# Patient Record
Sex: Female | Born: 1965 | Race: Black or African American | Hispanic: No | Marital: Single | Smoking: Current every day smoker
Health system: Southern US, Community
[De-identification: ages and names within clinical notes are randomized; demographics above are authoritative.]

## PROBLEM LIST (undated history)

## (undated) DIAGNOSIS — K219 Gastro-esophageal reflux disease without esophagitis: Secondary | ICD-10-CM

## (undated) DIAGNOSIS — J189 Pneumonia, unspecified organism: Secondary | ICD-10-CM

## (undated) HISTORY — PX: WISDOM TOOTH EXTRACTION: SHX21

---

## 2019-03-30 ENCOUNTER — Emergency Department (HOSPITAL_COMMUNITY): Payer: Self-pay

## 2019-03-30 ENCOUNTER — Ambulatory Visit
Admission: EM | Admit: 2019-03-30 | Discharge: 2019-03-30 | Disposition: A | Discharge status: Home / Self Care | Payer: Self-pay | Attending: Emergency Medicine | Admitting: Emergency Medicine

## 2019-03-30 ENCOUNTER — Ambulatory Visit (INDEPENDENT_AMBULATORY_CARE_PROVIDER_SITE_OTHER): Payer: Self-pay

## 2019-03-30 ENCOUNTER — Other Ambulatory Visit: Payer: Self-pay

## 2019-03-30 ENCOUNTER — Emergency Department (HOSPITAL_COMMUNITY)
Admission: EM | Admit: 2019-03-30 | Discharge: 2019-03-30 | Disposition: A | Discharge status: Home / Self Care | Payer: Self-pay | Attending: Emergency Medicine | Admitting: Emergency Medicine

## 2019-03-30 ENCOUNTER — Encounter (HOSPITAL_COMMUNITY): Payer: Self-pay | Admitting: Emergency Medicine

## 2019-03-30 DIAGNOSIS — S42201A Unspecified fracture of upper end of right humerus, initial encounter for closed fracture: Secondary | ICD-10-CM | POA: Insufficient documentation

## 2019-03-30 DIAGNOSIS — Y999 Unspecified external cause status: Secondary | ICD-10-CM | POA: Insufficient documentation

## 2019-03-30 DIAGNOSIS — Z87891 Personal history of nicotine dependence: Secondary | ICD-10-CM | POA: Insufficient documentation

## 2019-03-30 DIAGNOSIS — Y929 Unspecified place or not applicable: Secondary | ICD-10-CM | POA: Insufficient documentation

## 2019-03-30 DIAGNOSIS — Y939 Activity, unspecified: Secondary | ICD-10-CM | POA: Insufficient documentation

## 2019-03-30 DIAGNOSIS — W1839XA Other fall on same level, initial encounter: Secondary | ICD-10-CM | POA: Insufficient documentation

## 2019-03-30 LAB — CBC WITH DIFFERENTIAL/PLATELET
Abs Immature Granulocytes: 0.03 10*3/uL (ref 0.00–0.07)
Basophils Absolute: 0 10*3/uL (ref 0.0–0.1)
Basophils Relative: 0 %
Eosinophils Absolute: 0.1 10*3/uL (ref 0.0–0.5)
Eosinophils Relative: 1 %
HCT: 37.3 % (ref 36.0–46.0)
Hemoglobin: 12.1 g/dL (ref 12.0–15.0)
Immature Granulocytes: 0 %
Lymphocytes Relative: 27 %
Lymphs Abs: 1.9 10*3/uL (ref 0.7–4.0)
MCH: 29.5 pg (ref 26.0–34.0)
MCHC: 32.4 g/dL (ref 30.0–36.0)
MCV: 91 fL (ref 80.0–100.0)
Monocytes Absolute: 0.6 10*3/uL (ref 0.1–1.0)
Monocytes Relative: 9 %
Neutro Abs: 4.4 10*3/uL (ref 1.7–7.7)
Neutrophils Relative %: 63 %
Platelets: 389 10*3/uL (ref 150–400)
RBC: 4.1 MIL/uL (ref 3.87–5.11)
RDW: 13.3 % (ref 11.5–15.5)
WBC: 7.1 10*3/uL (ref 4.0–10.5)
nRBC: 0 % (ref 0.0–0.2)

## 2019-03-30 LAB — BASIC METABOLIC PANEL
Anion gap: 11 (ref 5–15)
BUN: 11 mg/dL (ref 6–20)
CO2: 23 mmol/L (ref 22–32)
Calcium: 9.8 mg/dL (ref 8.9–10.3)
Chloride: 107 mmol/L (ref 98–111)
Creatinine, Ser: 0.59 mg/dL (ref 0.44–1.00)
GFR calc Af Amer: >60 mL/min (ref >60)
GFR calc non Af Amer: >60 mL/min (ref >60)
Glucose, Bld: 118 mg/dL — ABNORMAL HIGH (ref 70–99)
Potassium: 3.6 mmol/L (ref 3.5–5.1)
Sodium: 141 mmol/L (ref 135–145)

## 2019-03-30 MED ORDER — OXYCODONE-ACETAMINOPHEN 5-325 MG PO TABS: 1.0000 | ORAL_TABLET | Freq: Four times a day (QID) | ORAL | 0 refills | Status: DC | PRN

## 2019-03-30 MED ORDER — IOHEXOL 350 MG/ML SOLN
100.0000 mL | Freq: Once | INTRAVENOUS | Status: AC | PRN
  Administered 2019-03-30: 100 mL via INTRAVENOUS

## 2019-03-30 MED ORDER — OXYCODONE-ACETAMINOPHEN 5-325 MG PO TABS
1.0000 | ORAL_TABLET | Freq: Once | ORAL | Status: AC
  Administered 2019-03-30: 1 via ORAL
  Filled 2019-03-30: qty 1

## 2019-03-30 NOTE — ED Provider Notes (Addendum)
[facility] EMERGENCY DEPARTMENT Provider Note   CSN: [id number] Arrival date & time: 03/30/19  1244    History   Chief Complaint Chief Complaint  Patient presents with  . Shoulder Injury    HPI Natalie Washington is a 53 y.o. female.     HPI   53 year old female presents today with complaints of humerus fracture.  Patient notes that 25 days ago she stood up got dizzy fell and struck her right shoulder.  She notes immediate pain at that time.  She did not believe it was a significant injury and felt that if she gave a time it would go away.  She notes she started develop bruising around the right upper arm, decreased range of motion and pain.  Patient denies any loss of sensation to the extremity, denies any loss of grip strength to the hand or range of motion at the elbow.  She notes she went to the health apartment today for evaluation of her dizziness where she had an EKG labs.  At that time she was diagnosed with yeast vaginitis.  They also referred her to urgent care for imaging of her right shoulder.  At urgent care she was noted to have decreased pulse in her right radius with the additional humerus fracture, she was referred to the emergency room at that time.  Patient recalls being told at 1 point in time that she did have weak pulses in the right arm but is very vague on this.  She denies any chest pain or shortness of breath.  She notes the dizziness is only present when she stands and is not consistent.  She denies any associated neurological deficits with this.   History reviewed. No pertinent past medical history.  There are no active problems to display for this patient.   History reviewed. No pertinent surgical history.   OB History   No obstetric history on file.      Home Medications    Prior to Admission medications   Medication Sig Start Date End Date Taking? Authorizing Provider  oxyCODONE-acetaminophen (PERCOCET/ROXICET) 5-325 MG tablet  Take 1 tablet by mouth every 6 (six) hours as needed for severe pain. 03/30/19   Okey Regal, PA-C    Family History Family History  Family history unknown: Yes    Social History Social History   Tobacco Use  . Smoking status: Former Research scientist (life sciences)  . Smokeless tobacco: Never Used  Substance Use Topics  . Alcohol use: Yes  . Drug use: Never     Allergies   Patient has no known allergies.   Review of Systems Review of Systems  All other systems reviewed and are negative.    Physical Exam Updated Vital Signs BP 109/81 (BP Location: Left Arm)   Pulse 88   Temp 98.7 F (37.1 C) (Oral)   Resp 18   Wt 64.4 kg   LMP  (LMP Unknown)   SpO2 100%   Physical Exam Vitals signs and nursing note reviewed.  Constitutional:      Appearance: She is well-developed.  HENT:     Head: Normocephalic and atraumatic.  Eyes:     General: No scleral icterus.       Right eye: No discharge.        Left eye: No discharge.     Conjunctiva/sclera: Conjunctivae normal.     Pupils: Pupils are equal, round, and reactive to light.  Neck:     Musculoskeletal: Normal range of motion.  Vascular: No JVD.     Trachea: No tracheal deviation.  Cardiovascular:     Rate and Rhythm: Normal rate and regular rhythm.  Pulmonary:     Effort: Pulmonary effort is normal.     Breath sounds: No stridor.  Musculoskeletal:     Comments: Minor swelling at the right proximal humerus with minimal tenderness, no overlying soft to injury-bruising noted to the medial mid humerus, joint compartments are soft, grip strength 5 out of 5 sensation and range of motion to the elbow wrist and hand intact-radial pulse 1+ right, radial pulse 2+ left- cap refill less than 3 seconds  Neurological:     Mental Status: She is alert and oriented to person, place, and time.     Coordination: Coordination normal.  Psychiatric:        Behavior: Behavior normal.        Thought Content: Thought content normal.        Judgment:  Judgment normal.     ED Treatments / Results  Labs (all labs ordered are listed, but only abnormal results are displayed) Labs Reviewed  BASIC METABOLIC PANEL - Abnormal; Notable for the following components:      Result Value   Glucose, Bld 118 (*)    All other components within normal limits  CBC WITH DIFFERENTIAL/PLATELET    EKG EKG Interpretation  Date/Time:  Tuesday March 30 2019 16:28:27 EDT Ventricular Rate:  91 PR Interval:    QRS Duration: 73 QT Interval:  347 QTC Calculation: 427 R Axis:   74 Text Interpretation:  Sinus rhythm Low voltage, precordial leads Baseline wander in lead(s) II III aVF V4 V5 Confirmed by Dene Gentry [phone]) on 03/30/2019 4:41:20 PM   Radiology Ct Angio Up Extrem Right W &/or Wo Contrast  Result Date: 03/30/2019 CLINICAL DATA:  Right arm pain for the past 5 days after falling on her right shoulder. Right arm claudication and diminished pulses. EXAM: CT ANGIOGRAPHY UPPER RIGHT EXTREMITY TECHNIQUE: Axial images of the right upper extremity were obtained during the intravenous administration of contrast with sagittal and coronal reconstruction images. CONTRAST:  165mL OMNIPAQUE IOHEXOL 350 MG/ML SOLN COMPARISON:  Right humerus radiographs dated 03/30/2019. FINDINGS: Previously described comminuted right humeral neck fracture with impaction and angulation. Edema in the subcutaneous fat dorsally at the level of the elbow and forearm. No forearm fracture or dislocation is seen. The right upper extremity arteries are normally opacified occlusions, stenosis or contrast extravasation. Review of the MIP images confirms the above findings. IMPRESSION: 1. Comminuted right humeral neck fracture with impaction and angulation. 2. No evidence of arterial injury, occlusion or stenosis. Electronically Signed   By: Claudie Revering M.D.   On: 03/30/2019 18:32   Dg Humerus Right  Result Date: 03/30/2019 CLINICAL DATA:  Fall several days ago with right arm pain, initial  encounter EXAM: RIGHT HUMERUS - 2+ VIEW COMPARISON:  None. FINDINGS: There is a fracture through the surgical neck of right humerus with impaction and angulation at the fracture site. Humeral head remains well seated. No other focal abnormality is noted. IMPRESSION: Fracture of the proximal right humerus as described. Electronically Signed   By: Inez Catalina M.D.   On: 03/30/2019 11:44    Procedures Procedures (including critical care time)  Medications Ordered in ED Medications  oxyCODONE-acetaminophen (PERCOCET/ROXICET) 5-325 MG per tablet 1 tablet (1 tablet Oral Given 03/30/19 1728)  iohexol (OMNIPAQUE) 350 MG/ML injection 100 mL (100 mLs Intravenous Contrast Given 03/30/19 1813)     Initial  Impression / Assessment and Plan / ED Course  I have reviewed the triage vital signs and the nursing notes.  Pertinent labs & imaging results that were available during my care of the patient were reviewed by me and considered in my medical decision making (see chart for details).        Labs: CBC, BMP  Imaging: CT angio right upper extremity, ED EKG  Consults:  Therapeutics:  Discharge Meds:   Assessment/Plan: 53 year old female presents today with proximal humerus fracture.  Patient does have unequal radial pulses, CT Angio of the arm shows no significant abnormality.  Given the fracture pattern this is very unlikely to be related to the fracture.  Patient does report some dizziness upon standing, none presently, she was worked up as an outpatient, no significant abnormalities noted on EKG or labs here.  No acute cardiac abnormality suspected at this time.  She will continue outpatient follow-up for this.  She will follow-up as an outpatient with orthopedic surgery for further evaluation management.  She is given strict return precautions.  She verbalized understanding and agreement to today's plan.   Final Clinical Impressions(s) / ED Diagnoses   Final diagnoses:  Closed fracture of  proximal end of right humerus, unspecified fracture morphology, initial encounter    ED Discharge Orders         Ordered    oxyCODONE-acetaminophen (PERCOCET/ROXICET) 5-325 MG tablet  Every 6 hours PRN     03/30/19 1841           Okey Regal, PA-C 03/30/19 2052    Reilyn Nelson, Dellis Filbert, PA-C 03/30/19 2052    Valarie Merino, MD 03/31/19 [phone]

## 2019-03-30 NOTE — ED Triage Notes (Signed)
Pt sent by [facility] with c/o R upper arm pain. Pt fell on Thursday, had an xray done today and it confirmed a R upper arm fx.

## 2019-03-30 NOTE — Consult Note (Signed)
Reason for Consult:Right humerus fx Referring Physician: Dot Lanes Richoux is an 53 y.o. female.  HPI: Natalie Washington was at home and got dizzy and fell. She has been getting dizzy for a while now. She hurt her right shoulder but it wasn't excruciating so she didn't come in for evaluation until today. This happened on Thursday. She is RHD.  History reviewed. No pertinent past medical history.  History reviewed. No pertinent surgical history.  Family History  Family history unknown: Yes    Social History:  reports that she has quit smoking. She has never used smokeless tobacco. She reports current alcohol use. She reports that she does not use drugs.  Allergies: No Known Allergies  Medications: I have reviewed the patient's current medications.  No results found for this or any previous visit (from the past 48 hour(s)).  Dg Humerus Right  Result Date: 03/30/2019 CLINICAL DATA:  Fall several days ago with right arm pain, initial encounter EXAM: RIGHT HUMERUS - 2+ VIEW COMPARISON:  None. FINDINGS: There is a fracture through the surgical neck of right humerus with impaction and angulation at the fracture site. Humeral head remains well seated. No other focal abnormality is noted. IMPRESSION: Fracture of the proximal right humerus as described. Electronically Signed   By: Inez Catalina M.D.   On: 03/30/2019 11:44    Review of Systems  Constitutional: Negative for weight loss.  HENT: Negative for ear discharge, ear pain, hearing loss and tinnitus.   Eyes: Negative for blurred vision, double vision, photophobia and pain.  Respiratory: Negative for cough, sputum production and shortness of breath.   Cardiovascular: Negative for chest pain.  Gastrointestinal: Negative for abdominal pain, nausea and vomiting.  Genitourinary: Negative for dysuria, flank pain, frequency and urgency.  Musculoskeletal: Positive for joint pain (Right shoulder/upper arm). Negative for back pain, falls,  myalgias and neck pain.  Neurological: Negative for dizziness, tingling, sensory change, focal weakness, loss of consciousness and headaches.  Endo/Heme/Allergies: Does not bruise/bleed easily.  Psychiatric/Behavioral: Negative for depression, memory loss and substance abuse. The patient is not nervous/anxious.    Blood pressure 109/81, pulse 88, temperature 98.7 F (37.1 C), temperature source Oral, resp. rate 18, weight 64.4 kg, SpO2 100 %. Physical Exam  Constitutional: She appears well-developed and well-nourished. No distress.  HENT:  Head: Normocephalic and atraumatic.  Eyes: Conjunctivae are normal. Right eye exhibits no discharge. Left eye exhibits no discharge. No scleral icterus.  Neck: Normal range of motion.  Cardiovascular: Normal rate and regular rhythm.  Respiratory: Effort normal. No respiratory distress.  Musculoskeletal:     Comments: Right shoulder, elbow, wrist, digits- no skin wounds, mild shoulder TTP, no instability, no blocks to motion  Sens  Ax/R/M/U intact  Mot   Ax/ R/ PIN/ M/ AIN/ U intact  Rad 1+, Uln 1+ (compared to 2+ on left)  Neurological: She is alert.  Skin: Skin is warm and dry. She is not diaphoretic.  Psychiatric: She has a normal mood and affect. Her behavior is normal.    Assessment/Plan: Right humerus fx -- Would recommend surgical fixation. Can either do tomorrow if she gets admitted for some reason or will plan to do as OP next week by Dr. Griffin Basil. Sling and NWB. I don't think her pulse differential is due to her fx. Diminished right upper extremity pulses -- Needs likely CTA or similar study to look at RUE vasculature. If she's admitted for something related to this please make her NPO after MN. Tobacco/EtOH use  Lisette Abu, PA-C Orthopedic Surgery [phone] 03/30/2019, 3:26 PM

## 2019-03-30 NOTE — ED Notes (Signed)
Pt to CT at this time via stretcher

## 2019-03-30 NOTE — ED Provider Notes (Addendum)
[facility]   [id number] 03/30/19 Arrival Time: 6063  CC: Right arm injury  SUBJECTIVE: History from: patient. Natalie Washington is a 53 y.o. female complains of right arm pain that began 5 days ago.  Symptoms began after she fell on her right shoulder.  Is unsure how she fell, or how long she was on the ground.  States she felt dizzy prior to falling.  Was evaluated by her PCP earlier today and sent here for x-rays.  Localizes the pain to the right shoulder, humerus, and elbow.  Pain 9/10.  Has tried OTC medications without relief.  Symptoms are made worse with shoulder and elbow ROM.  Complains of associated bruising and weakness.  Denies fever, chills, erythema, effusion, numbness and tingling.  ROS: As per HPI.  History reviewed. No pertinent past medical history. History reviewed. No pertinent surgical history. No Known Allergies No current facility-administered medications on file prior to encounter.    No current outpatient medications on file prior to encounter.   Social History   Socioeconomic History  . Marital status: Single    Spouse name: Not on file  . Number of children: Not on file  . Years of education: Not on file  . Highest education level: Not on file  Occupational History  . Not on file  Social Needs  . Financial resource strain: Not on file  . Food insecurity:    Worry: Not on file    Inability: Not on file  . Transportation needs:    Medical: Not on file    Non-medical: Not on file  Tobacco Use  . Smoking status: Former Research scientist (life sciences)  . Smokeless tobacco: Never Used  Substance and Sexual Activity  . Alcohol use: Yes  . Drug use: Never  . Sexual activity: Not on file  Lifestyle  . Physical activity:    Days per week: Not on file    Minutes per session: Not on file  . Stress: Not on file  Relationships  . Social connections:    Talks on phone: Not on file    Gets together: Not on file    Attends religious service: Not on file    Active  member of club or organization: Not on file    Attends meetings of clubs or organizations: Not on file    Relationship status: Not on file  . Intimate partner violence:    Fear of current or ex partner: Not on file    Emotionally abused: Not on file    Physically abused: Not on file    Forced sexual activity: Not on file  Other Topics Concern  . Not on file  Social History Narrative  . Not on file   Family History  Family history unknown: Yes    OBJECTIVE:  Vitals:   03/30/19 1057  BP: 111/76  Pulse: 74  Resp: 20  Temp: 98.1 F (36.7 C)  SpO2: 98%    General appearance: ALERT; appears uncomfortable Head: NCAT Lungs: Normal respiratory effort CV: Decreased RT radial pulse compared to LT radial pulse Musculoskeletal: Right arm Inspection: Significant ecchymosis, medial upper arm Palpation: Diffusely TTP over RT anterior shoulder, humerus, and posterior elbow ROM: LROM about elbow and shoulder Strength: shoulder and elbow strength deferred due to discomfort; 3-4/5 grip strength Skin: warm and dry Neurologic: Ambulates without difficulty; Sensation intact about the upper extremities Psychological: alert and cooperative; normal mood and affect  DIAGNOSTIC STUDIES:  Dg Humerus Right  Result Date: 03/30/2019 CLINICAL DATA:  Fall  several days ago with right arm pain, initial encounter EXAM: RIGHT HUMERUS - 2+ VIEW COMPARISON:  None. FINDINGS: There is a fracture through the surgical neck of right humerus with impaction and angulation at the fracture site. Humeral head remains well seated. No other focal abnormality is noted. IMPRESSION: Fracture of the proximal right humerus as described. Electronically Signed   By: Inez Catalina M.D.   On: 03/30/2019 11:44     ASSESSMENT & PLAN:  1. Closed fracture of proximal end of right humerus, unspecified fracture morphology, initial encounter    Recommending further evaluation and management in the ED for humeral head fracture x 5  days with decreased radial pulse.  Patient and mother aware.  Will go to [facility] ED by private vehicle.       Lestine Box, PA-C 03/30/19 1157

## 2019-03-30 NOTE — Discharge Instructions (Addendum)
Recommending further evaluation and management in the ED for humeral head fracture x 5 days with decreased radial pulse.  Patient and mother aware.  Will go to [facility] ED by private vehicle.

## 2019-03-30 NOTE — ED Triage Notes (Signed)
Pt fell last Thursday on right arm, pt has severre bruising to upper right arm and deformity noted

## 2019-03-30 NOTE — ED Notes (Signed)
Inadvertently checked off CT task while reviewing rad chart.

## 2019-03-30 NOTE — Discharge Instructions (Signed)
Please read attached information. If you experience any new or worsening signs or symptoms please return to the emergency room for evaluation. Please follow-up with your primary care provider or specialist as discussed. Please use medication prescribed only as directed and discontinue taking if you have any concerning signs or symptoms.   °

## 2019-03-30 NOTE — H&P (View-Only) (Signed)
Reason for Consult:Right humerus fx Referring Physician: Dot Lanes Washington is an 53 y.o. female.  HPI: Natalie Washington was at home and got dizzy and fell. She has been getting dizzy for a while now. She hurt her right shoulder but it wasn't excruciating so she didn't come in for evaluation until today. This happened on Thursday. She is RHD.  History reviewed. No pertinent past medical history.  History reviewed. No pertinent surgical history.  Family History  Family history unknown: Yes    Social History:  reports that she has quit smoking. She has never used smokeless tobacco. She reports current alcohol use. She reports that she does not use drugs.  Allergies: No Known Allergies  Medications: I have reviewed the patient's current medications.  No results found for this or any previous visit (from the past 48 hour(s)).  Dg Humerus Right  Result Date: 03/30/2019 CLINICAL DATA:  Fall several days ago with right arm pain, initial encounter EXAM: RIGHT HUMERUS - 2+ VIEW COMPARISON:  None. FINDINGS: There is a fracture through the surgical neck of right humerus with impaction and angulation at the fracture site. Humeral head remains well seated. No other focal abnormality is noted. IMPRESSION: Fracture of the proximal right humerus as described. Electronically Signed   By: Inez Catalina M.D.   On: 03/30/2019 11:44    Review of Systems  Constitutional: Negative for weight loss.  HENT: Negative for ear discharge, ear pain, hearing loss and tinnitus.   Eyes: Negative for blurred vision, double vision, photophobia and pain.  Respiratory: Negative for cough, sputum production and shortness of breath.   Cardiovascular: Negative for chest pain.  Gastrointestinal: Negative for abdominal pain, nausea and vomiting.  Genitourinary: Negative for dysuria, flank pain, frequency and urgency.  Musculoskeletal: Positive for joint pain (Right shoulder/upper arm). Negative for back pain, falls,  myalgias and neck pain.  Neurological: Negative for dizziness, tingling, sensory change, focal weakness, loss of consciousness and headaches.  Endo/Heme/Allergies: Does not bruise/bleed easily.  Psychiatric/Behavioral: Negative for depression, memory loss and substance abuse. The patient is not nervous/anxious.    Blood pressure 109/81, pulse 88, temperature 98.7 F (37.1 C), temperature source Oral, resp. rate 18, weight 64.4 kg, SpO2 100 %. Physical Exam  Constitutional: She appears well-developed and well-nourished. No distress.  HENT:  Head: Normocephalic and atraumatic.  Eyes: Conjunctivae are normal. Right eye exhibits no discharge. Left eye exhibits no discharge. No scleral icterus.  Neck: Normal range of motion.  Cardiovascular: Normal rate and regular rhythm.  Respiratory: Effort normal. No respiratory distress.  Musculoskeletal:     Comments: Right shoulder, elbow, wrist, digits- no skin wounds, mild shoulder TTP, no instability, no blocks to motion  Sens  Ax/R/M/U intact  Mot   Ax/ R/ PIN/ M/ AIN/ U intact  Rad 1+, Uln 1+ (compared to 2+ on left)  Neurological: She is alert.  Skin: Skin is warm and dry. She is not diaphoretic.  Psychiatric: She has a normal mood and affect. Her behavior is normal.    Assessment/Plan: Right humerus fx -- Would recommend surgical fixation. Can either do tomorrow if she gets admitted for some reason or will plan to do as OP next week by Dr. Griffin Basil. Sling and NWB. I don't think her pulse differential is due to her fx. Diminished right upper extremity pulses -- Needs likely CTA or similar study to look at RUE vasculature. If she's admitted for something related to this please make her NPO after MN. Tobacco/EtOH use  Lisette Abu, PA-C Orthopedic Surgery [phone] 03/30/2019, 3:26 PM

## 2019-04-02 NOTE — Progress Notes (Signed)
03/30/2019- noted in Epic- EKG and LABS- CBC, w/diff., BMP

## 2019-04-02 NOTE — Patient Instructions (Signed)
Natalie [facility]  04/02/2019   Your procedure is scheduled on: Wednesday 04/07/2019  Report to [facility] [place] [facility]             Report to admitting at  1125  AM              [facility] @_______ , THIS TEST MUST BE DONE BEFORE SURGERY, COME TO [facility].    Call this number if you have problems the morning of surgery [phone]    Remember: Do not eat food :After Midnight.              NO SOLID FOOD AFTER MIDNIGHT THE NIGHT PRIOR TO SURGERY. NOTHING BY MOUTH EXCEPT CLEAR LIQUIDS UNTIL  0725 am.               PLEASE FINISH ENSURE DRINK PER SURGEON ORDER WHICH NEEDS TO BE COMPLETED AT  0430 am.   CLEAR LIQUID DIET   Foods Allowed                                                                     Foods Excluded  Coffee and tea, regular and decaf                             liquids that you cannot  Plain Jell-O in any flavor                                             see through such as: Fruit ices (not with fruit pulp)                                     milk, soups, orange juice  Iced Popsicles                                    All solid food Carbonated beverages, regular and diet                                    Cranberry, grape and apple juices Sports drinks like Gatorade Lightly seasoned clear broth or consume(fat free) Sugar, honey syrup  Sample Menu Breakfast                                Lunch                                     Supper Cranberry juice                    Beef broth  Chicken broth Jell-O                                     Grape juice                           Apple juice Coffee or tea                        Jell-O                                      Popsicle                                                Coffee or tea                        Coffee or  tea  _____________________________________________________________________               BRUSH YOUR TEETH MORNING OF SURGERY AND RINSE YOUR MOUTH OUT, NO CHEWING GUM CANDY OR MINTS.     Take these medicines the morning of surgery with A SIP OF WATER: may take Oxycodone-acetaminophen if needed for pain                                 You may not have any metal on your body including hair pins and              piercings  Do not wear jewelry, make-up, lotions, powders or perfumes, deodorant             Do not wear nail polish.  Do not shave  48 hours prior to surgery.                Do not bring valuables to the [facility]. [place].  Contacts, dentures or bridgework may not be worn into surgery.  Leave suitcase in the car. After surgery it may be brought to your room.                   Please read over the following fact sheets you were given: _____________________________________________________________________             [facility] - Preparing for Surgery Before surgery, you can play an important role.  Because skin is not sterile, your skin needs to be as free of germs as possible.  You can reduce the number of germs on your skin by washing with CHG (chlorahexidine gluconate) soap before surgery.  CHG is an antiseptic cleaner which kills germs and bonds with the skin to continue killing germs even after washing. Please DO NOT use if you have an allergy to CHG or antibacterial soaps.  If your skin becomes reddened/irritated stop using the CHG and inform your nurse when you arrive at Short Stay. Do not shave (including legs and underarms) for at least 48 hours prior to the first CHG shower.  You  may shave your face/neck. Please follow these instructions carefully:  1.  Shower with CHG Soap the night before surgery and the  morning of Surgery.  2.  If you choose to wash your hair, wash your hair first as usual with your  normal   shampoo.  3.  After you shampoo, rinse your hair and body thoroughly to remove the  shampoo.                           4.  Use CHG as you would any other liquid soap.  You can apply chg directly  to the skin and wash                       Gently with a scrungie or clean washcloth.  5.  Apply the CHG Soap to your body ONLY FROM THE NECK DOWN.   Do not use on face/ open                           Wound or open sores. Avoid contact with eyes, ears mouth and genitals (private parts).                       Wash face,  Genitals (private parts) with your normal soap.             6.  Wash thoroughly, paying special attention to the area where your surgery  will be performed.  7.  Thoroughly rinse your body with warm water from the neck down.  8.  DO NOT shower/wash with your normal soap after using and rinsing off  the CHG Soap.                9.  Pat yourself dry with a clean towel.            10.  Wear clean pajamas.            11.  Place clean sheets on your bed the night of your first shower and do not  sleep with pets. Day of Surgery : Do not apply any lotions/deodorants the morning of surgery.  Please wear clean clothes to the [facility]/surgery center.  FAILURE TO FOLLOW THESE INSTRUCTIONS MAY RESULT IN THE CANCELLATION OF YOUR SURGERY PATIENT SIGNATURE_________________________________  NURSE SIGNATURE__________________________________  ________________________________________________________________________   [facility]  An incentive spirometer is a tool that can help keep your lungs clear and active. This tool measures how well you are filling your lungs with each breath. Taking long deep breaths may help reverse or decrease the chance of developing breathing (pulmonary) problems (especially infection) following:  A long period of time when you are unable to move or be active. BEFORE THE PROCEDURE   If the spirometer includes an indicator to show your best effort, your nurse or  respiratory therapist will set it to a desired goal.  If possible, sit up straight or lean slightly forward. Try not to slouch.  Hold the incentive spirometer in an upright position. INSTRUCTIONS FOR USE  1. Sit on the edge of your bed if possible, or sit up as far as you can in bed or on a chair. 2. Hold the incentive spirometer in an upright position. 3. Breathe out normally. 4. Place the mouthpiece in your mouth and seal your lips tightly around it. 5. Breathe in slowly and as deeply as possible, raising  the piston or the ball toward the top of the column. 6. Hold your breath for 3-5 seconds or for as long as possible. Allow the piston or ball to fall to the bottom of the column. 7. Remove the mouthpiece from your mouth and breathe out normally. 8. Rest for a few seconds and repeat Steps 1 through 7 at least 10 times every 1-2 hours when you are awake. Take your time and take a few normal breaths between deep breaths. 9. The spirometer may include an indicator to show your best effort. Use the indicator as a goal to work toward during each repetition. 10. After each set of 10 deep breaths, practice coughing to be sure your lungs are clear. If you have an incision (the cut made at the time of surgery), support your incision when coughing by placing a pillow or rolled up towels firmly against it. Once you are able to get out of bed, walk around indoors and cough well. You may stop using the incentive spirometer when instructed by your caregiver.  RISKS AND COMPLICATIONS  Take your time so you do not get dizzy or light-headed.  If you are in pain, you may need to take or ask for pain medication before doing incentive spirometry. It is harder to take a deep breath if you are having pain. AFTER USE  Rest and breathe slowly and easily.  It can be helpful to keep track of a log of your progress. Your caregiver can provide you with a simple table to help with this. If you are using the  spirometer at home, follow these instructions: [place] IF:   You are having difficultly using the spirometer.  You have trouble using the spirometer as often as instructed.  Your pain medication is not giving enough relief while using the spirometer.  You develop fever of 100.5 F (38.1 C) or higher. SEEK IMMEDIATE MEDICAL CARE IF:   You cough up bloody sputum that had not been present before.  You develop fever of 102 F (38.9 C) or greater.  You develop worsening pain at or near the incision site. MAKE SURE YOU:   Understand these instructions.  Will watch your condition.  Will get help right away if you are not doing well or get worse. Document Released: 02/24/2007 Document Revised: 01/06/2012 Document Reviewed: 04/27/2007 [facility] Patient Information 2014 [place], [place].   ________________________________________________________________________

## 2019-04-05 ENCOUNTER — Encounter (HOSPITAL_COMMUNITY): Payer: Self-pay

## 2019-04-05 ENCOUNTER — Encounter (HOSPITAL_COMMUNITY)
Admission: RE | Admit: 2019-04-05 | Discharge: 2019-04-05 | Disposition: A | Discharge status: Home / Self Care | Payer: Self-pay | Source: Ambulatory Visit | Attending: Orthopaedic Surgery | Admitting: Orthopaedic Surgery

## 2019-04-05 ENCOUNTER — Other Ambulatory Visit (HOSPITAL_COMMUNITY)
Admission: RE | Admit: 2019-04-05 | Discharge: 2019-04-05 | Disposition: A | Discharge status: Home / Self Care | Payer: Self-pay | Source: Ambulatory Visit | Attending: Orthopaedic Surgery | Admitting: Orthopaedic Surgery

## 2019-04-05 ENCOUNTER — Other Ambulatory Visit: Payer: Self-pay

## 2019-04-05 DIAGNOSIS — Z01812 Encounter for preprocedural laboratory examination: Secondary | ICD-10-CM | POA: Insufficient documentation

## 2019-04-05 DIAGNOSIS — Z1159 Encounter for screening for other viral diseases: Secondary | ICD-10-CM | POA: Insufficient documentation

## 2019-04-05 HISTORY — DX: Pneumonia, unspecified organism: J18.9

## 2019-04-05 LAB — SARS CORONAVIRUS 2 BY RT PCR (HOSPITAL ORDER, PERFORMED IN ~~LOC~~ HOSPITAL LAB): SARS Coronavirus 2: NEGATIVE

## 2019-04-06 ENCOUNTER — Other Ambulatory Visit: Payer: Self-pay

## 2019-04-06 NOTE — Progress Notes (Signed)
SPOKE W/  [place] 19:   COUGH--no  RUNNY NOSE--- no  SORE THROAT--- no  NASAL CONGESTION----no  SNEEZING----no  SHORTNESS OF BREATH---no  DIFFICULTY BREATHING---no  TEMP >100.0 -----no  UNEXPLAINED BODY ACHES------no  CHILLS -------- no  HEADACHES ---------no  LOSS OF SMELL/ TASTE --------no    HAVE YOU OR ANY FAMILY MEMBER TRAVELLED PAST 14 DAYS OUT OF THE   COUNTY---no STATE----no COUNTRY----no  HAVE YOU OR ANY FAMILY MEMBER BEEN EXPOSED TO ANYONE WITH COVID 19? no

## 2019-04-07 ENCOUNTER — Ambulatory Visit (HOSPITAL_COMMUNITY): Payer: Self-pay

## 2019-04-07 ENCOUNTER — Ambulatory Visit (HOSPITAL_COMMUNITY)
Admission: RE | Admit: 2019-04-07 | Discharge: 2019-04-07 | Disposition: A | Discharge status: Home / Self Care | Payer: Self-pay | Attending: Orthopaedic Surgery | Admitting: Orthopaedic Surgery

## 2019-04-07 ENCOUNTER — Ambulatory Visit (HOSPITAL_COMMUNITY): Payer: Self-pay | Admitting: Certified Registered Nurse Anesthetist

## 2019-04-07 ENCOUNTER — Encounter (HOSPITAL_COMMUNITY)
Admission: RE | Disposition: A | Discharge status: Home / Self Care | Payer: Self-pay | Source: Home / Self Care | Attending: Orthopaedic Surgery

## 2019-04-07 ENCOUNTER — Encounter (HOSPITAL_COMMUNITY): Payer: Self-pay | Admitting: *Deleted

## 2019-04-07 ENCOUNTER — Ambulatory Visit (HOSPITAL_COMMUNITY): Payer: Self-pay | Admitting: Physician Assistant

## 2019-04-07 DIAGNOSIS — Y92009 Unspecified place in unspecified non-institutional (private) residence as the place of occurrence of the external cause: Secondary | ICD-10-CM | POA: Insufficient documentation

## 2019-04-07 DIAGNOSIS — S42201A Unspecified fracture of upper end of right humerus, initial encounter for closed fracture: Secondary | ICD-10-CM | POA: Insufficient documentation

## 2019-04-07 DIAGNOSIS — Z09 Encounter for follow-up examination after completed treatment for conditions other than malignant neoplasm: Secondary | ICD-10-CM

## 2019-04-07 DIAGNOSIS — W19XXXA Unspecified fall, initial encounter: Secondary | ICD-10-CM | POA: Insufficient documentation

## 2019-04-07 DIAGNOSIS — Z419 Encounter for procedure for purposes other than remedying health state, unspecified: Secondary | ICD-10-CM

## 2019-04-07 HISTORY — PX: ORIF HUMERUS FRACTURE: SHX2126

## 2019-04-07 SURGERY — OPEN REDUCTION INTERNAL FIXATION (ORIF) PROXIMAL HUMERUS FRACTURE
Anesthesia: General | Site: Arm Upper | Laterality: Right

## 2019-04-07 MED ORDER — OXYCODONE HCL 5 MG PO TABS: ORAL_TABLET | ORAL | 0 refills | Status: AC

## 2019-04-07 MED ORDER — MIDAZOLAM HCL 2 MG/2ML IJ SOLN
INTRAMUSCULAR | Status: AC
  Administered 2019-04-07: 2 mg via INTRAVENOUS
  Filled 2019-04-07: qty 2

## 2019-04-07 MED ORDER — MELOXICAM 7.5 MG PO TABS: 7.5000 mg | ORAL_TABLET | Freq: Every day | ORAL | 2 refills | Status: DC

## 2019-04-07 MED ORDER — OXYCODONE HCL 5 MG/5ML PO SOLN: 5.0000 mg | Freq: Once | ORAL | Status: DC | PRN

## 2019-04-07 MED ORDER — ONDANSETRON HCL 4 MG/2ML IJ SOLN
INTRAMUSCULAR | Status: DC | PRN
  Administered 2019-04-07: 4 mg via INTRAVENOUS

## 2019-04-07 MED ORDER — FENTANYL CITRATE (PF) 100 MCG/2ML IJ SOLN
25.0000 ug | INTRAMUSCULAR | Status: DC | PRN
  Administered 2019-04-07: 25 ug via INTRAVENOUS

## 2019-04-07 MED ORDER — MIDAZOLAM HCL 2 MG/2ML IJ SOLN
1.0000 mg | INTRAMUSCULAR | Status: DC
  Administered 2019-04-07: 2 mg via INTRAVENOUS

## 2019-04-07 MED ORDER — DEXAMETHASONE SODIUM PHOSPHATE 10 MG/ML IJ SOLN
INTRAMUSCULAR | Status: DC | PRN
  Administered 2019-04-07: 10 mg via INTRAVENOUS

## 2019-04-07 MED ORDER — FENTANYL CITRATE (PF) 100 MCG/2ML IJ SOLN
INTRAMUSCULAR | Status: DC | PRN
  Administered 2019-04-07: 50 ug via INTRAVENOUS

## 2019-04-07 MED ORDER — LACTATED RINGERS IV SOLN
INTRAVENOUS | Status: DC
  Administered 2019-04-07 (×2): via INTRAVENOUS

## 2019-04-07 MED ORDER — SODIUM CHLORIDE 0.9 % IV SOLN
INTRAVENOUS | Status: DC | PRN
  Administered 2019-04-07: 50 ug/min via INTRAVENOUS

## 2019-04-07 MED ORDER — GLYCOPYRROLATE PF 0.2 MG/ML IJ SOSY
PREFILLED_SYRINGE | INTRAMUSCULAR | Status: DC | PRN
  Administered 2019-04-07: .1 mg via INTRAVENOUS

## 2019-04-07 MED ORDER — CHLORHEXIDINE GLUCONATE 4 % EX LIQD: 60.0000 mL | Freq: Once | CUTANEOUS | Status: DC

## 2019-04-07 MED ORDER — ACETAMINOPHEN 500 MG PO TABS: 1000.0000 mg | ORAL_TABLET | Freq: Three times a day (TID) | ORAL | 0 refills | Status: AC

## 2019-04-07 MED ORDER — ONDANSETRON HCL 4 MG/2ML IJ SOLN: 4.0000 mg | Freq: Once | INTRAMUSCULAR | Status: DC | PRN

## 2019-04-07 MED ORDER — VANCOMYCIN HCL 1 G IV SOLR
INTRAVENOUS | Status: DC | PRN
  Administered 2019-04-07: 1000 mg via TOPICAL

## 2019-04-07 MED ORDER — BUPIVACAINE LIPOSOME 1.3 % IJ SUSP
INTRAMUSCULAR | Status: DC | PRN
  Administered 2019-04-07: 10 mL

## 2019-04-07 MED ORDER — BUPIVACAINE HCL (PF) 0.5 % IJ SOLN
INTRAMUSCULAR | Status: DC | PRN
  Administered 2019-04-07: 15 mL via PERINEURAL

## 2019-04-07 MED ORDER — FENTANYL CITRATE (PF) 100 MCG/2ML IJ SOLN
50.0000 ug | INTRAMUSCULAR | Status: DC
  Administered 2019-04-07: 100 ug via INTRAVENOUS
  Filled 2019-04-07: qty 2

## 2019-04-07 MED ORDER — SUCCINYLCHOLINE CHLORIDE 200 MG/10ML IV SOSY
PREFILLED_SYRINGE | INTRAVENOUS | Status: DC | PRN
  Administered 2019-04-07: 60 mg via INTRAVENOUS

## 2019-04-07 MED ORDER — VANCOMYCIN HCL 1000 MG IV SOLR
INTRAVENOUS | Status: AC
  Filled 2019-04-07: qty 1000

## 2019-04-07 MED ORDER — GLYCOPYRROLATE PF 0.2 MG/ML IJ SOSY
PREFILLED_SYRINGE | INTRAMUSCULAR | Status: AC
  Filled 2019-04-07: qty 1

## 2019-04-07 MED ORDER — ONDANSETRON HCL 4 MG PO TABS: 4.0000 mg | ORAL_TABLET | Freq: Three times a day (TID) | ORAL | 1 refills | Status: AC | PRN

## 2019-04-07 MED ORDER — CEFAZOLIN SODIUM-DEXTROSE 2-4 GM/100ML-% IV SOLN
2.0000 g | INTRAVENOUS | Status: AC
  Administered 2019-04-07: 2 g via INTRAVENOUS
  Filled 2019-04-07: qty 100

## 2019-04-07 MED ORDER — PROPOFOL 10 MG/ML IV BOLUS
INTRAVENOUS | Status: DC | PRN
  Administered 2019-04-07: 100 mg via INTRAVENOUS

## 2019-04-07 MED ORDER — OXYCODONE HCL 5 MG PO TABS: 5.0000 mg | ORAL_TABLET | Freq: Once | ORAL | Status: DC | PRN

## 2019-04-07 MED ORDER — FENTANYL CITRATE (PF) 100 MCG/2ML IJ SOLN
INTRAMUSCULAR | Status: AC
  Filled 2019-04-07: qty 2

## 2019-04-07 SURGICAL SUPPLY — 49 items
BENZOIN TINCTURE PRP APPL 2/3 (GAUZE/BANDAGES/DRESSINGS) ×3 IMPLANT
BIT DRILL 3.2 (BIT) ×2
BIT DRILL 3.2XCALB NS DISP (BIT) ×1 IMPLANT
BIT DRILL CALIBRATED 2.7 (BIT) ×2 IMPLANT
BIT DRILL CALIBRATED 2.7MM (BIT) ×1
BIT DRL 3.2XCALB NS DISP (BIT) ×1
CHLORAPREP W/TINT 26 (MISCELLANEOUS) ×3 IMPLANT
CLOSURE WOUND 1/2 X4 (GAUZE/BANDAGES/DRESSINGS) ×1
COVER WAND RF STERILE (DRAPES) IMPLANT
DRAPE C-ARM 42X120 X-RAY (DRAPES) IMPLANT
DRAPE IMP U-DRAPE 54X76 (DRAPES) ×6 IMPLANT
DRAPE INCISE IOBAN 66X45 STRL (DRAPES) ×3 IMPLANT
DRAPE SHEET LG 3/4 BI-LAMINATE (DRAPES) ×3 IMPLANT
DRAPE U-SHAPE 47X51 STRL (DRAPES) IMPLANT
DRSG AQUACEL AG ADV 3.5X 6 (GAUZE/BANDAGES/DRESSINGS) ×3 IMPLANT
ELECT REM PT RETURN 15FT ADLT (MISCELLANEOUS) ×3 IMPLANT
GLOVE BIOGEL PI IND STRL 8 (GLOVE) ×1 IMPLANT
GLOVE BIOGEL PI INDICATOR 8 (GLOVE) ×2
GLOVE ECLIPSE 8.0 STRL XLNG CF (GLOVE) ×3 IMPLANT
GOWN STRL REUS W/TWL 2XL LVL3 (GOWN DISPOSABLE) ×3 IMPLANT
GOWN STRL REUS W/TWL XL LVL3 (GOWN DISPOSABLE) ×3 IMPLANT
K-WIRE 2X5 SS THRDED S3 (WIRE) ×6
KIT BASIN OR (CUSTOM PROCEDURE TRAY) ×3 IMPLANT
KIT STABILIZATION SHOULDER (MISCELLANEOUS) ×3 IMPLANT
KWIRE 2X5 SS THRDED S3 (WIRE) ×2 IMPLANT
NS IRRIG 1000ML POUR BTL (IV SOLUTION) ×3 IMPLANT
PACK SHOULDER (CUSTOM PROCEDURE TRAY) ×3 IMPLANT
PEG LOCKING 3.2MMX46 (Peg) ×3 IMPLANT
PEG LOCKING 3.2X32 (Peg) ×6 IMPLANT
PEG LOCKING 3.2X34 (Screw) ×3 IMPLANT
PEG LOCKING 3.2X36 (Screw) ×6 IMPLANT
PLATE PROX HUM LO R 4H 83 (Plate) ×3 IMPLANT
SCREW LOCK CORT STAR 3.5X22 (Screw) ×3 IMPLANT
SCREW T15 MD 3.5X22MM NS (Screw) ×3 IMPLANT
SCREW T15 MD 3.5X24MM NS (Screw) ×3 IMPLANT
SLING ARM FOAM STRAP LRG (SOFTGOODS) IMPLANT
SLING ULTRA III MED (ORTHOPEDIC SUPPLIES) ×3 IMPLANT
SPONGE LAP 18X18 RF (DISPOSABLE) ×3 IMPLANT
STRIP CLOSURE SKIN 1/2X4 (GAUZE/BANDAGES/DRESSINGS) ×2 IMPLANT
SUT BROADBAND TAPE 2PK 1.5 (SUTURE) ×3 IMPLANT
SUT ETHIBOND 2 OS 4 DA (SUTURE) ×3 IMPLANT
SUT MAXBRAID #2 CVD NDL (SUTURE) ×3 IMPLANT
SUT MNCRL AB 4-0 PS2 18 (SUTURE) ×3 IMPLANT
SUT VIC AB 0 CT1 27 (SUTURE) ×2
SUT VIC AB 0 CT1 27XBRD ANBCTR (SUTURE) ×1 IMPLANT
SUT VIC AB 2-0 SH 27 (SUTURE) ×2
SUT VIC AB 2-0 SH 27XBRD (SUTURE) ×1 IMPLANT
SUT VIC AB 3-0 SH 27 (SUTURE)
SUT VIC AB 3-0 SH 27X BRD (SUTURE) IMPLANT

## 2019-04-07 NOTE — Transfer of Care (Signed)
Immediate Anesthesia Transfer of Care Note  Patient: Natalie [facility]  Procedure(s) Performed: OPEN REDUCTION INTERNAL FIXATION (ORIF) PROXIMAL HUMERUS FRACTURE; BICEP TENODESIS (Right Arm Upper)  Patient Location: PACU  Anesthesia Type:GA combined with regional for post-op pain  Level of Consciousness: awake, alert  and oriented  Airway & Oxygen Therapy: Patient Spontanous Breathing and Patient connected to face mask oxygen  Post-op Assessment: Report given to RN and Post -op Vital signs reviewed and stable  Post vital signs: Reviewed and stable  Last Vitals:  Vitals Value Taken Time  BP 104/70 04/07/2019  4:00 PM  Temp    Pulse 75 04/07/2019  4:03 PM  Resp 18 04/07/2019  4:03 PM  SpO2 100 % 04/07/2019  4:03 PM  Vitals shown include unvalidated device data.  Last Pain:  Vitals:   04/07/19 1211  PainSc: 3          Complications: No apparent anesthesia complications

## 2019-04-07 NOTE — Interval H&P Note (Signed)
History and Physical Interval Note:  04/07/2019 1:53 PM  Natalie [facility]  has presented today for surgery, with the diagnosis of RIGHT PROXIMAL HUMERUS FRACTURE.  The various methods of treatment have been discussed with the patient and family. After consideration of risks, benefits and other options for treatment, the patient has consented to  Procedure(s): OPEN REDUCTION INTERNAL FIXATION (ORIF) PROXIMAL HUMERUS FRACTURE (Right) as a surgical intervention.  The patient's history has been reviewed, patient examined, no change in status, stable for surgery.  I have reviewed the patient's chart and labs.  Questions were answered to the patient's satisfaction.     Hiram Gash

## 2019-04-07 NOTE — Progress Notes (Signed)
Assisted Dr. Witman with right, ultrasound guided, interscalene  block. Side rails up, monitors on throughout procedure. See vital signs in flow sheet. Tolerated Procedure well. 

## 2019-04-07 NOTE — Anesthesia Procedure Notes (Signed)
Procedure Name: Intubation Date/Time: 04/07/2019 2:04 PM Performed by: [place] Sherene Plancarte C, CRNA Pre-anesthesia Checklist: Patient identified, Emergency Drugs available, Suction available and Patient being monitored Patient Re-evaluated:Patient Re-evaluated prior to induction Oxygen Delivery Method: Circle system utilized Preoxygenation: Pre-oxygenation with 100% oxygen Induction Type: IV induction Ventilation: Mask ventilation without difficulty Laryngoscope Size: Mac and 3 Grade View: Grade I Tube type: Oral Tube size: 7.0 mm Number of attempts: 1 Airway Equipment and Method: Stylet and Oral airway Placement Confirmation: ETT inserted through vocal cords under direct vision,  positive ETCO2 and breath sounds checked- equal and bilateral Tube secured with: Tape Dental Injury: Teeth and Oropharynx as per pre-operative assessment

## 2019-04-07 NOTE — Anesthesia Preprocedure Evaluation (Addendum)
Anesthesia Evaluation  Patient identified by MRN, date of birth, ID band Patient awake    Reviewed: Allergy & Precautions, NPO status , Patient's Chart, lab work & pertinent test results  History of Anesthesia Complications Negative for: history of anesthetic complications  Airway Mallampati: II  TM Distance: >3 FB Neck ROM: Full    Dental no notable dental hx.    Pulmonary Current Smoker,    Pulmonary exam normal        Cardiovascular negative cardio ROS Normal cardiovascular exam     Neuro/Psych negative neurological ROS  negative psych ROS   GI/Hepatic negative GI ROS, Neg liver ROS,   Endo/Other  negative endocrine ROS  Renal/GU negative Renal ROS  negative genitourinary   Musculoskeletal negative musculoskeletal ROS (+)   Abdominal   Peds  Hematology negative hematology ROS (+)   Anesthesia Other Findings   Reproductive/Obstetrics                           Anesthesia Physical Anesthesia Plan  ASA: II  Anesthesia Plan: General   Post-op Pain Management: GA combined w/ Regional for post-op pain   Induction: Intravenous  PONV Risk Score and Plan: 2 and Ondansetron, Dexamethasone, Midazolam and Treatment may vary due to age or medical condition  Airway Management Planned: Oral ETT  Additional Equipment: None  Intra-op Plan:   Post-operative Plan: Extubation in OR  Informed Consent: I have reviewed the patients History and Physical, chart, labs and discussed the procedure including the risks, benefits and alternatives for the proposed anesthesia with the patient or authorized representative who has indicated his/her understanding and acceptance.     Dental advisory given  Plan Discussed with:   Anesthesia Plan Comments:        Anesthesia Quick Evaluation

## 2019-04-07 NOTE — Anesthesia Procedure Notes (Signed)
Anesthesia Regional Block: Interscalene brachial plexus block   Pre-Anesthetic Checklist: ,, timeout performed, Correct Patient, Correct Site, Correct Laterality, Correct Procedure, Correct Position, site marked, Risks and benefits discussed,  Surgical consent,  Pre-op evaluation,  At surgeon's request and post-op pain management  Laterality: Right  Prep: chloraprep       Needles:  Injection technique: Single-shot  Needle Type: Echogenic Stimulator Needle     Needle Length: 9cm  Needle Gauge: 21     Additional Needles:   Procedures:,,,, ultrasound used (permanent image in chart),,,,  Narrative:  Start time: 04/07/2019 1:30 PM End time: 04/07/2019 1:36 PM Injection made incrementally with aspirations every 5 mL.  Performed by: Personally  Anesthesiologist: Lidia Collum, MD  Additional Notes: Monitors applied. Injection made in 5cc increments. No resistance to injection. Good needle visualization. Patient tolerated procedure well.

## 2019-04-07 NOTE — Op Note (Signed)
Orthopaedic Surgery Operative Note (CSN: [id number])  Baelyn Shanks  [birth year] Date of Surgery: 04/07/2019   Diagnoses:  RIGHT PROXIMAL HUMERUS FRACTURE  Procedure: Open reduction internal fixation right proximal humerus fracture Open suprapectoral biceps tenodesis   Operative Finding Successful completion of planned procedure.  Patient had a three-part fracture as the lesser was moving with the head.  The greater tuberosity was displaced and she had significant malalignment initially.  We were able to get her nearly anatomically reduced.  Good plate fixation and reasonable bone quality was noted.  The biceps tendon was caught in the fragments and we felt that it was damaged significantly and would likely benefit from tenodesis that she would likely have pain otherwise.  Post-operative plan: The patient will be nonweightbearing in a sling with therapy to start about 3 to 4 weeks after surgery.  The patient will be discharged home.  DVT prophylaxis not indicated in isolated upper extremity surgery patient with no specific risks factors..  Pain control with PRN pain medication preferring oral medicines.  Follow up plan will be scheduled in approximately 7 days for incision check and XR.  Post-Op Diagnosis: Same Surgeons:Primary: Hiram Gash, MD Assistants:Brandon Lynnell Jude Location: [facility] ROOM 06 Anesthesia: General with interscalene block Antibiotics: Ancef 2g preop, Vancomycin 1000mg  locally  Tourniquet time: * No tourniquets in log * Estimated Blood Loss: 50 mL Complications: None Specimens: None Implants: Implant Name Type Inv. Item Serial No. Manufacturer Lot No. LRB No. Used  PLATE PROX HUM LO R 4H 83 - [id number] Plate PLATE PROX HUM LO R 4H 83  [employer]IM[employer]ON(ORTH,TRAU,BIO,SG)  Right 1  PEG LOCKING 3.2X32 - [id number] Peg PEG LOCKING 3.2X32  [employer](ORTH,TRAU,BIO,SG)  Right 2  PEG LOCKING 3.2X36 - [id number] Screw PEG LOCKING 3.2X36  [employer]ON(ORTH,TRAU,BIO,SG)  Right 2   PEG LOCKING 3.2MMX46 - [id number] Peg PEG LOCKING 3.2MMX46  Z[employer]ON(ORTH,TRAU,BIO,SG)  Right 1  PEG LOCKING 3.2X34 - [id number] Screw PEG LOCKING 3.2X34  [employer]ON(ORTH,TRAU,BIO,SG)  Right 1  SCREW T15 MD 3.5X22MM NS - [id number] Screw SCREW T15 MD 3.5X22MM NS  [employer](ORTH,TRAU,BIO,SG)  Right 1  SCREW T15 MD 3.5X24MM NS - [id number] Screw SCREW T15 MD 3.5X24MM NS  [employer](ORTH,TRAU,BIO,SG)  Right 1  SCREW LOCK CORT STAR 3.5X22 - [id number] Screw SCREW LOCK CORT STAR 3.5X22  [employer]IM[employer]ON(ORTH,TRAU,BIO,SG)  Right 1    Indications for Surgery:   Jaislyn Knotek is a 53 y.o. female with fall resulting in a right comminuted proximal humerus fracture.  Due to patient's young age and overall high functional status in the setting of displaced fracture refill she would benefit from operative fixation.  Benefits and risks of operative and nonoperative management were discussed prior to surgery with patient/guardian(s) and informed consent form was completed.  Specific risks including infection, need for additional surgery, cut out, AVN, loss of fixation, stiffness and the need for therapy as well as overall loss of function.  Her native joint.   Procedure:   The patient was identified in the preoperative holding area where the surgical site was marked. The patient was taken to the OR where a procedural timeout was called and the above noted anesthesia was induced.  The patient was positioned beachchair on [employer] table.  Preoperative antibiotics were dosed.  The patient's right shoulder was prepped and draped in the usual sterile fashion.  A second preoperative timeout was called.      A tourniquet was used for the above listed time.  Began with a deltopectoral approach  making a 8 cm incision from the coracoid to the humeral shaft.  We dissected the skin sharply achieving hemostasis we progressed and then identified the deltopectoral interval just taking the cephalic vein laterally.  We split  the interval and were able to go through the adhesions and identify the clavipectoral fascia.  This was opened in line just lateral to the conjoined tendon and we were able to identify immediately the fracture.  The pectoralis upper border was released about 30% to facilitate in visualization.  We noted that the biceps tendon was caught in the fracture fragment itself.  We this point performed a tenodesis tying it to the pectoralis with a #2 Ethibond.  This was done without issue.  We then traced the biceps up into the joint extricating it from the fracture itself and noting no significant damage.  We are able to identify it in the interval and perform a tenotomy at the level of the labrum.  This point we identified a fracture fragments including fracture hematoma and callus.  We were able to manipulate the fracture with indirect mechanisms as well as direct mechanisms and achieve a near anatomic reduction.  Stay sutures in the form of max braid tape were placed into the greater tuberosity x2 in the lesser tuberosity x1.  We are able to use these to manipulate the fracture and achieve a near anatomic alignment.  Once this was performed we selected a Biomet Alps plate and positioned it under fluoroscopic guidance in the appropriate position.  K wire fixation was initially used to hold the plate in place.  That point we used a nonlocking screw in the oblong hole in the shaft to achieve a preliminary reduction of the shaft of the plate.  We then were happy with her overall alignment and placed a series of nonlocking pegs into the head taking care to note that we did not perforate the articular surface of the subchondral bone with each.  We subtracted 2 to 4 mm on each PEG to ensure that we were not going to violate the articular surface.  We checked each of these with fluoroscopy.  At that point we placed a locking and nonlocking screw additionally in the shaft achieving 6 cortices of fixation distal to the  fracture.  We then took around the world views and noted a near anatomic reduction with good alignment of the head relative to the shaft.  Tuberosities were secured with their sutures tied at the plate.  Final fluoroscopic images were satisfactory.  Incision was copiously irrigated prior to placing local vancomycin powder.  Deltopectoral interval was closed with a #1 Ethibond in a loose interrupted fashion as a landmark for potential future surgery.  At that point we closed the skin with verbal suture in a multilayer fashion place Steri-Strips and Aquasol dressing.  Patient was placed in a sling and awoken from anesthesia and taken the PACU in stable condition.  Joya Gaskins, OPA-C, present and scrubbed throughout the case, critical for completion in a timely fashion, and for retraction, instrumentation, closure.

## 2019-04-08 ENCOUNTER — Encounter (HOSPITAL_COMMUNITY): Payer: Self-pay | Admitting: Orthopaedic Surgery

## 2019-04-08 NOTE — Anesthesia Postprocedure Evaluation (Signed)
Anesthesia Post Note  Patient: Environmental education officer  Procedure(s) Performed: OPEN REDUCTION INTERNAL FIXATION (ORIF) PROXIMAL HUMERUS FRACTURE; BICEP TENODESIS (Right Arm Upper)     Patient location during evaluation: PACU Anesthesia Type: General Level of consciousness: awake and alert Pain management: pain level controlled Vital Signs Assessment: post-procedure vital signs reviewed and stable Respiratory status: spontaneous breathing, nonlabored ventilation and respiratory function stable Cardiovascular status: blood pressure returned to baseline and stable Postop Assessment: no apparent nausea or vomiting Anesthetic complications: no    Last Vitals:  Vitals:   04/07/19 1700 04/07/19 1730  BP: 98/66 98/67  Pulse: 63 64  Resp: 15   Temp: 36.4 C 36.4 C  SpO2: 96% 98%    Last Pain:  Vitals:   04/07/19 1730  PainSc: 0-No pain                 Lidia Collum

## 2020-01-23 ENCOUNTER — Encounter (HOSPITAL_COMMUNITY): Payer: Self-pay | Admitting: *Deleted

## 2020-01-23 ENCOUNTER — Other Ambulatory Visit: Payer: Self-pay

## 2020-01-23 ENCOUNTER — Emergency Department (HOSPITAL_COMMUNITY): Payer: Medicaid Other

## 2020-01-23 ENCOUNTER — Ambulatory Visit
Admission: EM | Admit: 2020-01-23 | Discharge: 2020-01-23 | Disposition: A | Discharge status: Home / Self Care | Payer: Medicaid Other | Attending: Emergency Medicine | Admitting: Emergency Medicine

## 2020-01-23 ENCOUNTER — Inpatient Hospital Stay (HOSPITAL_COMMUNITY)
Admission: EM | Admit: 2020-01-23 | Discharge: 2020-01-25 | DRG: 378 | Disposition: A | Discharge status: Home / Self Care | Payer: Medicaid Other | Source: Ambulatory Visit | Attending: Internal Medicine | Admitting: Internal Medicine

## 2020-01-23 DIAGNOSIS — Z800 Family history of malignant neoplasm of digestive organs: Secondary | ICD-10-CM

## 2020-01-23 DIAGNOSIS — R195 Other fecal abnormalities: Secondary | ICD-10-CM

## 2020-01-23 DIAGNOSIS — F17210 Nicotine dependence, cigarettes, uncomplicated: Secondary | ICD-10-CM | POA: Diagnosis present

## 2020-01-23 DIAGNOSIS — D62 Acute posthemorrhagic anemia: Secondary | ICD-10-CM | POA: Diagnosis present

## 2020-01-23 DIAGNOSIS — Z20822 Contact with and (suspected) exposure to covid-19: Secondary | ICD-10-CM | POA: Diagnosis present

## 2020-01-23 DIAGNOSIS — R42 Dizziness and giddiness: Secondary | ICD-10-CM | POA: Insufficient documentation

## 2020-01-23 DIAGNOSIS — K922 Gastrointestinal hemorrhage, unspecified: Secondary | ICD-10-CM

## 2020-01-23 DIAGNOSIS — D649 Anemia, unspecified: Secondary | ICD-10-CM

## 2020-01-23 DIAGNOSIS — K921 Melena: Secondary | ICD-10-CM

## 2020-01-23 DIAGNOSIS — R531 Weakness: Secondary | ICD-10-CM

## 2020-01-23 LAB — CBC WITH DIFFERENTIAL/PLATELET
Abs Immature Granulocytes: 0.04 10*3/uL (ref 0.00–0.07)
Basophils Absolute: 0 10*3/uL (ref 0.0–0.1)
Basophils Relative: 0 %
Eosinophils Absolute: 0.1 10*3/uL (ref 0.0–0.5)
Eosinophils Relative: 1 %
HCT: 17.3 % — ABNORMAL LOW (ref 36.0–46.0)
Hemoglobin: 5.1 g/dL — CL (ref 12.0–15.0)
Immature Granulocytes: 1 %
Lymphocytes Relative: 19 %
Lymphs Abs: 1.6 10*3/uL (ref 0.7–4.0)
MCH: 22.1 pg — ABNORMAL LOW (ref 26.0–34.0)
MCHC: 29.5 g/dL — ABNORMAL LOW (ref 30.0–36.0)
MCV: 74.9 fL — ABNORMAL LOW (ref 80.0–100.0)
Monocytes Absolute: 0.8 10*3/uL (ref 0.1–1.0)
Monocytes Relative: 9 %
Neutro Abs: 5.8 10*3/uL (ref 1.7–7.7)
Neutrophils Relative %: 70 %
Platelets: 392 10*3/uL (ref 150–400)
RBC: 2.31 MIL/uL — ABNORMAL LOW (ref 3.87–5.11)
RDW: 17.3 % — ABNORMAL HIGH (ref 11.5–15.5)
WBC: 8.3 10*3/uL (ref 4.0–10.5)
nRBC: 0 % (ref 0.0–0.2)

## 2020-01-23 LAB — COMPREHENSIVE METABOLIC PANEL
ALT: 35 U/L (ref 0–44)
AST: 41 U/L (ref 15–41)
Albumin: 2.4 g/dL — ABNORMAL LOW (ref 3.5–5.0)
Alkaline Phosphatase: 275 U/L — ABNORMAL HIGH (ref 38–126)
Anion gap: 8 (ref 5–15)
BUN: 8 mg/dL (ref 6–20)
CO2: 25 mmol/L (ref 22–32)
Calcium: 8.3 mg/dL — ABNORMAL LOW (ref 8.9–10.3)
Chloride: 106 mmol/L (ref 98–111)
Creatinine, Ser: 0.43 mg/dL — ABNORMAL LOW (ref 0.44–1.00)
GFR calc Af Amer: >60 mL/min (ref >60)
GFR calc non Af Amer: >60 mL/min (ref >60)
Glucose, Bld: 97 mg/dL (ref 70–99)
Potassium: 4 mmol/L (ref 3.5–5.1)
Sodium: 139 mmol/L (ref 135–145)
Total Bilirubin: 0.4 mg/dL (ref 0.3–1.2)
Total Protein: 6.2 g/dL — ABNORMAL LOW (ref 6.5–8.1)

## 2020-01-23 LAB — POC OCCULT BLOOD, ED: Fecal Occult Bld: POSITIVE — AB

## 2020-01-23 LAB — ABO/RH: ABO/RH(D): AB POS

## 2020-01-23 LAB — PREPARE RBC (CROSSMATCH)

## 2020-01-23 MED ORDER — BISACODYL 10 MG RE SUPP: 10.0000 mg | Freq: Every day | RECTAL | Status: DC | PRN

## 2020-01-23 MED ORDER — SODIUM CHLORIDE 0.9% FLUSH: 3.0000 mL | INTRAVENOUS | Status: DC | PRN

## 2020-01-23 MED ORDER — TRAMADOL HCL 50 MG PO TABS: 50.0000 mg | ORAL_TABLET | Freq: Three times a day (TID) | ORAL | Status: DC | PRN

## 2020-01-23 MED ORDER — ONDANSETRON HCL 4 MG PO TABS: 4.0000 mg | ORAL_TABLET | Freq: Four times a day (QID) | ORAL | Status: DC | PRN

## 2020-01-23 MED ORDER — ACETAMINOPHEN 325 MG PO TABS
650.0000 mg | ORAL_TABLET | Freq: Four times a day (QID) | ORAL | Status: DC | PRN
  Administered 2020-01-23: 650 mg via ORAL
  Filled 2020-01-23: qty 2

## 2020-01-23 MED ORDER — MECLIZINE HCL 12.5 MG PO TABS
25.0000 mg | ORAL_TABLET | Freq: Once | ORAL | Status: AC
  Administered 2020-01-23: 25 mg via ORAL
  Filled 2020-01-23: qty 2

## 2020-01-23 MED ORDER — SODIUM CHLORIDE 0.9 % IV SOLN
80.0000 mg | Freq: Once | INTRAVENOUS | Status: AC
  Administered 2020-01-24: 80 mg via INTRAVENOUS
  Filled 2020-01-23: qty 80

## 2020-01-23 MED ORDER — SODIUM CHLORIDE 0.9% FLUSH
3.0000 mL | Freq: Two times a day (BID) | INTRAVENOUS | Status: DC
  Administered 2020-01-23 – 2020-01-24 (×2): 3 mL via INTRAVENOUS

## 2020-01-23 MED ORDER — LACTATED RINGERS IV SOLN: INTRAVENOUS | Status: DC

## 2020-01-23 MED ORDER — ONDANSETRON HCL 4 MG/2ML IJ SOLN: 4.0000 mg | Freq: Four times a day (QID) | INTRAMUSCULAR | Status: DC | PRN

## 2020-01-23 MED ORDER — POLYETHYLENE GLYCOL 3350 17 G PO PACK: 17.0000 g | PACK | Freq: Every day | ORAL | Status: DC | PRN

## 2020-01-23 MED ORDER — SODIUM CHLORIDE 0.9 % IV SOLN
8.0000 mg/h | INTRAVENOUS | Status: DC
  Administered 2020-01-24 (×2): 8 mg/h via INTRAVENOUS
  Filled 2020-01-23 (×7): qty 80

## 2020-01-23 MED ORDER — ACETAMINOPHEN 650 MG RE SUPP: 650.0000 mg | Freq: Four times a day (QID) | RECTAL | Status: DC | PRN

## 2020-01-23 MED ORDER — SODIUM CHLORIDE 0.9 % IV SOLN: 250.0000 mL | INTRAVENOUS | Status: DC | PRN

## 2020-01-23 MED ORDER — SODIUM CHLORIDE 0.9% IV SOLUTION: Freq: Once | INTRAVENOUS | Status: AC

## 2020-01-23 NOTE — ED Notes (Signed)
Date and time results received: 01/23/20 5:51 PM    Test: Hgb Critical Value: 5.1   Name of Provider Notified: Dr. Lacinda Axon  Orders Received? Or Actions Taken?:see orders

## 2020-01-23 NOTE — ED Triage Notes (Signed)
Pt presents with c/o dizzy spells for past few days, pt also reports that she has some vomiting and diarrhea earlier in the week

## 2020-01-23 NOTE — Progress Notes (Signed)
Post transfusion temp 100.9. Heat on 80 degrees in room and multiple blankets on pt. States she was very cold at first and asked tech to turn up heat. Notified Dr. Scherrie November to make aware and informed of this, no other s/s of reaction. States to administer tylenol and proceed with second unit of blood. Will continue to monitor closely.

## 2020-01-23 NOTE — ED Provider Notes (Addendum)
[facility]    CSN: [id number] Arrival date & time: 01/23/20  1502      History   Chief Complaint Chief Complaint  Patient presents with  . Dizziness    HPI Natalie Washington is a 54 y.o. female.   Who presented to the urgent care with a complaint of dizziness for the past few days.  Reported nausea and vomiting  in the week.  Denies a precipitating event, trauma, or recent URI within the past month.  Describes the dizziness as "feeling like she is going to fall".  States that it is constant with episodes lasting all day.  Has  not tried any medication. Symptoms made worse with walking and better with laying down.  Denies similar symptoms in the past.  Denies fever, chills, hearing changes, tinnitus, ear pain, chest pain, syncope, SOB, weakness, slurred speech, memory or emotional changes, facial drooping/ asymmetry, incoordination, numbness or tingling, abdominal pain, changes in bowel or bladder habits.     The history is provided by the patient. No language interpreter was used.    Past Medical History:  Diagnosis Date  . Pneumonia    1980    There are no problems to display for this patient.   Past Surgical History:  Procedure Laterality Date  . ORIF HUMERUS FRACTURE Right 04/07/2019   Procedure: OPEN REDUCTION INTERNAL FIXATION (ORIF) PROXIMAL HUMERUS FRACTURE; BICEP TENODESIS;  Surgeon: Hiram Gash, MD;  Location: [facility];  Service: Orthopedics;  Laterality: Right;  . WISDOM TOOTH EXTRACTION      OB History   No obstetric history on file.      Home Medications    Prior to Admission medications   Medication Sig Start Date End Date Taking? Authorizing Provider  meloxicam (MOBIC) 7.5 MG tablet Take 1 tablet (7.5 mg total) by mouth daily. 04/07/19 04/06/20  Hiram Gash, MD  sulfamethoxazole-trimethoprim (BACTRIM DS) 800-160 MG tablet Take 1 tablet by mouth 2 (two) times daily.    [provider]    Family History Family History  Family  history unknown: Yes    Social History Social History   Tobacco Use  . Smoking status: Current Every Day Smoker    Packs/day: 0.25    Years: 15.00    Pack years: 3.75  . Smokeless tobacco: Never Used  Substance Use Topics  . Alcohol use: Yes    Comment: 2-3 week  . Drug use: Never     Allergies   Patient has no known allergies.   Review of Systems Review of Systems  Constitutional: Negative.   HENT: Negative.   Respiratory: Negative.   Cardiovascular: Negative.   Neurological: Positive for dizziness.  All other systems reviewed and are negative.    Physical Exam Triage Vital Signs ED Triage Vitals  Enc Vitals Group     BP      Pulse      Resp      Temp      Temp src      SpO2      Weight      Height      Head Circumference      Peak Flow      Pain Score      Pain Loc      Pain Edu?      Excl. in [place]?    Orthostatic VS for the past 24 hrs:  BP- Sitting Pulse- Sitting BP- Standing at 0 minutes Pulse- Standing at 0 minutes  01/23/20  1523 100/69 100 103/72 102    Updated Vital Signs BP 114/75   Pulse 100   Temp 98 F (36.7 C)   Resp 18   SpO2 98%   Visual Acuity Right Eye Distance:   Left Eye Distance:   Bilateral Distance:    Right Eye Near:   Left Eye Near:    Bilateral Near:     Physical Exam Vitals and nursing note reviewed.  Constitutional:      General: She is not in acute distress.    Appearance: Normal appearance. She is normal weight. She is not ill-appearing or toxic-appearing.  HENT:     Head: Normocephalic.     Right Ear: Tympanic membrane, ear canal and external ear normal. There is no impacted cerumen.     Left Ear: Tympanic membrane, ear canal and external ear normal. There is no impacted cerumen.  Cardiovascular:     Rate and Rhythm: Normal rate and regular rhythm.     Pulses: Normal pulses.     Heart sounds: Normal heart sounds. No murmur. No gallop.   Pulmonary:     Effort: Pulmonary effort is normal. No respiratory  distress.     Breath sounds: Normal breath sounds. No stridor. No wheezing, rhonchi or rales.  Chest:     Chest wall: No tenderness.  Neurological:     General: No focal deficit present.     Mental Status: She is alert and oriented to person, place, and time.     GCS: GCS eye subscore is 4. GCS verbal subscore is 5. GCS motor subscore is 6.     Cranial Nerves: Cranial nerves are intact.     Sensory: Sensation is intact. No sensory deficit.     Motor: Motor function is intact.     Coordination: Coordination is intact.     Gait: Gait is intact.      UC Treatments / Results  Labs (all labs ordered are listed, but only abnormal results are displayed) Labs Reviewed - No data to display  EKG   Radiology No results found.  Procedures Procedures (including critical care time)  Medications Ordered in UC Medications - No data to display  Initial Impression / Assessment and Plan / UC Course  I have reviewed the triage vital signs and the nursing notes.  Pertinent labs & imaging results that were available during my care of the patient were reviewed by me and considered in my medical decision making (see chart for details).    Patient is discharged to go to ED for further evaluation and workup  Orthostatic blood pressure was negative ED EKG showed normal sinus rhythm with nonspecific T wave abnormality  Final Clinical Impressions(s) / UC Diagnoses   Final diagnoses:  Dizziness     Discharge Instructions     Patient was discharged to go to the emergency department for further evaluation    ED Prescriptions    None     I have reviewed the PDMP during this encounter.       Emerson Monte, FNP 01/23/20 1819

## 2020-01-23 NOTE — ED Triage Notes (Signed)
Patient sent over by urgent care due to weakness, dizziness for one week.

## 2020-01-23 NOTE — ED Provider Notes (Addendum)
[facility] EMERGENCY DEPARTMENT Provider Note   CSN: [id number] Arrival date & time: 01/23/20  1547     History Chief Complaint  Patient presents with  . Weakness    Natalie Washington is a 54 y.o. female.  Dizziness for the past few days.  Seen in urgent care center and sent to the emergency department.  Symptoms are worse with ambulation and better with lying down.  No chest pain, dyspnea, black stool.  Takes ibuprofen occasionally.  She claims to be normally healthy.  Drinks alcohol.  Severity is moderate.        Past Medical History:  Diagnosis Date  . Pneumonia    1980    There are no problems to display for this patient.   Past Surgical History:  Procedure Laterality Date  . ORIF HUMERUS FRACTURE Right 04/07/2019   Procedure: OPEN REDUCTION INTERNAL FIXATION (ORIF) PROXIMAL HUMERUS FRACTURE; BICEP TENODESIS;  Surgeon: Hiram Gash, MD;  Location: [facility];  Service: Orthopedics;  Laterality: Right;  . WISDOM TOOTH EXTRACTION       OB History   No obstetric history on file.     Family History  Family history unknown: Yes    Social History   Tobacco Use  . Smoking status: Current Every Day Smoker    Packs/day: 0.25    Years: 15.00    Pack years: 3.75  . Smokeless tobacco: Never Used  Substance Use Topics  . Alcohol use: Yes    Comment: 2-3 week  . Drug use: Never    Home Medications Prior to Admission medications   Not on File    Allergies    Patient has no known allergies.  Review of Systems   Review of Systems  All other systems reviewed and are negative.   Physical Exam Updated Vital Signs BP 107/73   Pulse 89   Temp 98.7 F (37.1 C) (Oral)   Resp 20   Ht 5\' 1"  (1.549 m)   Wt 66.7 kg   SpO2 100%   BMI 27.78 kg/m   Physical Exam Vitals and nursing note reviewed.  Constitutional:      Appearance: She is well-developed.     Comments: nad  HENT:     Head: Normocephalic and atraumatic.  Eyes:     Conjunctiva/sclera:  Conjunctivae normal.  Cardiovascular:     Rate and Rhythm: Normal rate and regular rhythm.  Pulmonary:     Effort: Pulmonary effort is normal.     Breath sounds: Normal breath sounds.  Abdominal:     General: Bowel sounds are normal.     Palpations: Abdomen is soft.  Genitourinary:    Comments: Rectal exam: No masses, brown stool, heme positive Musculoskeletal:        General: Normal range of motion.     Cervical back: Neck supple.  Skin:    General: Skin is warm and dry.  Neurological:     General: No focal deficit present.     Mental Status: She is alert and oriented to person, place, and time.  Psychiatric:        Behavior: Behavior normal.     ED Results / Procedures / Treatments   Labs (all labs ordered are listed, but only abnormal results are displayed) Labs Reviewed  CBC WITH DIFFERENTIAL/PLATELET - Abnormal; Notable for the following components:      Result Value   RBC 2.31 (*)    Hemoglobin 5.1 (*)    HCT 17.3 (*)  MCV 74.9 (*)    MCH 22.1 (*)    MCHC 29.5 (*)    RDW 17.3 (*)    All other components within normal limits  COMPREHENSIVE METABOLIC PANEL - Abnormal; Notable for the following components:   Creatinine, Ser 0.43 (*)    Calcium 8.3 (*)    Total Protein 6.2 (*)    Albumin 2.4 (*)    Alkaline Phosphatase 275 (*)    All other components within normal limits  POC OCCULT BLOOD, ED  TYPE AND SCREEN  PREPARE RBC (CROSSMATCH)    EKG None  Radiology CT Head Wo Contrast  Result Date: 01/23/2020 CLINICAL DATA:  Dizziness for several days. Vomiting and diarrhea. No reported injury. EXAM: CT HEAD WITHOUT CONTRAST TECHNIQUE: Contiguous axial images were obtained from the base of the skull through the vertex without intravenous contrast. COMPARISON:  None. FINDINGS: Brain: No evidence of parenchymal hemorrhage or extra-axial fluid collection. No mass lesion, mass effect, or midline shift. No CT evidence of acute infarction. Cerebral volume is age  appropriate. No ventriculomegaly. Vascular: No acute abnormality. Skull: No evidence of calvarial fracture. Sinuses/Orbits: The visualized paranasal sinuses are essentially clear. Other:  The mastoid air cells are unopacified. IMPRESSION: Negative head CT without contrast. No evidence of acute intracranial abnormality. Electronically Signed   By: Ilona Sorrel M.D.   On: 01/23/2020 18:17    Procedures Procedures (including critical care time)  Medications Ordered in ED Medications  0.9 %  sodium chloride infusion (Manually program via Guardrails IV Fluids) (has no administration in time range)  meclizine (ANTIVERT) tablet 25 mg (25 mg Oral Given 01/23/20 1724)    ED Course  I have reviewed the triage vital signs and the nursing notes.  Pertinent labs & imaging results that were available during my care of the patient were reviewed by me and considered in my medical decision making (see chart for details).    MDM Rules/Calculators/A&P                      Patient is anemic (hemoglobin 5.1).  Heme positive stool.  Will transfuse and admit. Final Clinical Impression(s) / ED Diagnoses Final diagnoses:  Weakness  Dizziness  Anemia, unspecified type    Rx / DC Orders ED Discharge Orders    None       Nat Christen, MD 01/23/20 1850    Nat Christen, MD 01/23/20 [phone]

## 2020-01-23 NOTE — Discharge Instructions (Signed)
Patient was discharged to go to the emergency department for further evaluation

## 2020-01-23 NOTE — H&P (Addendum)
History and Physical    Patient DemographicsPhoenicia Washington [id number] DOB: 1966-08-17 DOA: 01/23/2020  PCP: Patient, No Pcp Per  Patient coming from: Home  I have personally briefly reviewed patient's old medical records in [place]  Chief Complaint: Weakness, dizziness   Assessment & Plan:     Assessment/Plan Principal Problem:   GI bleed Active Problems:   Symptomatic anemia     Principal Problem: Acute GI bleed, likely upper source Patient presented with weakness as noted below.  Found to have significantly worsened anemia.  Hemoccult was positive in the ER.  Does have a history of moderate alcohol use as well as use of ibuprofen.  No prior history of GI bleed and no prior EGD/colonoscopy.  Differential diagnosis include peptic ulcer disease versus gastritis versus AVM. -Keep n.p.o. overnight -Monitor CBC -We will place on PPI drip -GI consult in a.m.  Other Active Problems: Symptomatic anemia Patient presented with increased dizziness, weakness, difficulty with ambulation.  Found to have a hemoglobin of 5.1. Most recent hemoglobin was 12.1 in June 2020. -We will transfuse with a goal of around 8  DVT prophylaxis: SCDs Code Status:  Full code Family Communication: N/A  Disposition Plan: Admitted as inpatient for symptomatic anemia, GI bleed.  Will need GI work-up, blood transfusions Consults called: N/A Admission status: Inpatient status    HPI:     HPI: Natalie Washington is a 54 y.o. female with no significant medical history who presented to the ER with weakness, dizziness.  Patient presented to the ER with worsening weakness, dizziness over the last several days.  Was seen at an urgent care clinic and sent into the emergency department.  Was noted to have increased dizziness and worsening symptoms with ambulation which improved with rest.  Patient reports taking ibuprofen at least 3-4 times per week for headaches.  Takes up to 4 tablets a day.   Also has a history of moderate alcohol consumption but last drink was about 1 month ago.  Does report having an episode of black tarry stool today.  No prior history of GI bleed. No chest pain, fever, chills, cough, palpitations, shortness of breath. No nausea, vomiting, abdominal pain, diarrhea, dysuria No hematemesis, hematochezia as per patient ED Course:  Vital Signs reviewed on presentation, significant for temperature 98.2, heart rate 88, blood pressure 112/76, saturating 100% on room air. Labs reviewed, significant for sodium 139, potassium 4.0, BUN 8, creatinine 0.4, calcium 8.3, alkaline phosphatase 275, AST and ALT are normal, total bilirubin is normal.  Her bili is count 8.3, hemoglobin 5.1, hematocrit 17, MCV 74, fecal occult blood is positive. Imaging personally Reviewed, head CT shows no acute intracranial abnormalities. EKG personally reviewed, shows sinus rhythm, no acute ST changes. Patient was found to have Hemoccult positive in the ER.  Hemoglobin 5.1 as noted above.  Transfusion has been started.    Review of systems:    Review of Systems: As per HPI otherwise 10 point review of systems negative.  All other review of systems is negative except the ones noted above in the HPI.    Past Medical and Surgical History:  Reviewed by me  Past Medical History:  Diagnosis Date  . Pneumonia    1980    Past Surgical History:  Procedure Laterality Date  . ORIF HUMERUS FRACTURE Right 04/07/2019   Procedure: OPEN REDUCTION INTERNAL FIXATION (ORIF) PROXIMAL HUMERUS FRACTURE; BICEP TENODESIS;  Surgeon: Hiram Gash, MD;  Location: [facility];  Service: Orthopedics;  Laterality: Right;  . WISDOM TOOTH EXTRACTION       Social History:  Reviewed by me   reports that she has been smoking. She has a 3.75 pack-year smoking history. She has never used smokeless tobacco. She reports current alcohol use. She reports that she does not use drugs.  Allergies:    No Known  Allergies  Family History :   Family History  Family history unknown: Yes   Family history reviewed, patient's uncle has a history of cancer, unknown type  Home Medications:    Prior to Admission medications   Not on File    Physical Exam:    Physical Exam: Vitals:   01/23/20 1700 01/23/20 1830 01/23/20 1845 01/23/20 1900  BP: 92/65 107/73  (!) 103/47  Pulse: 85 89 86   Resp: (!) 23 20 (!) 22 15  Temp:      TempSrc:      SpO2: 97% 100% 100%   Weight:      Height:        Constitutional: NAD, calm, comfortable Vitals:   01/23/20 1700 01/23/20 1830 01/23/20 1845 01/23/20 1900  BP: 92/65 107/73  (!) 103/47  Pulse: 85 89 86   Resp: (!) 23 20 (!) 22 15  Temp:      TempSrc:      SpO2: 97% 100% 100%   Weight:      Height:       Eyes: PERRL, lids and conjunctivae normal ENMT: Mucous membranes are moist. Posterior pharynx clear of any exudate or lesions.Normal dentition.  Neck: normal, supple, no masses, no thyromegaly Respiratory: clear to auscultation bilaterally, no wheezing, no crackles. Normal respiratory effort. No accessory muscle use.  Cardiovascular: Regular rate and rhythm, no murmurs / rubs / gallops. No extremity edema. 2+ pedal pulses. No carotid bruits.  Abdomen: no tenderness, no masses palpated. No hepatosplenomegaly. Bowel sounds positive.  Musculoskeletal: no clubbing / cyanosis. No joint deformity upper and lower extremities. Good ROM, no contractures. Normal muscle tone.  Skin: no rashes, lesions, ulcers. No induration Neurologic: CN 2-12 grossly intact. Sensation intact, DTR normal. Strength 5/5 in all 4.  Psychiatric: Normal judgment and insight. Alert and oriented x 3. Normal mood.    Decubitus Ulcers: Not present on admission Catheters and tubes: None  Data Review:    Labs on Admission: I have personally reviewed following labs and imaging studies  CBC: Recent Labs  Lab 01/23/20 1721  WBC 8.3  NEUTROABS 5.8  HGB 5.1*  HCT 17.3*  MCV  74.9*  PLT 0000000   Basic Metabolic Panel: Recent Labs  Lab 01/23/20 1721  NA 139  K 4.0  CL 106  CO2 25  GLUCOSE 97  BUN 8  CREATININE 0.43*  CALCIUM 8.3*   GFR: Estimated Creatinine Clearance: 70.3 mL/min (A) (by C-G formula based on SCr of 0.43 mg/dL (L)). Liver Function Tests: Recent Labs  Lab 01/23/20 1721  AST 41  ALT 35  ALKPHOS 275*  BILITOT 0.4  PROT 6.2*  ALBUMIN 2.4*   No results for input(s): LIPASE, AMYLASE in the last 168 hours. No results for input(s): AMMONIA in the last 168 hours. Coagulation Profile: No results for input(s): INR, PROTIME in the last 168 hours. Cardiac Enzymes: No results for input(s): CKTOTAL, CKMB, CKMBINDEX, TROPONINI in the last 168 hours. BNP (last 3 results) No results for input(s): PROBNP in the last 8760 hours. HbA1C: No results for input(s): HGBA1C in the last 72 hours. CBG: No results for input(s): GLUCAP  in the last 168 hours. Lipid Profile: No results for input(s): CHOL, HDL, LDLCALC, TRIG, CHOLHDL, LDLDIRECT in the last 72 hours. Thyroid Function Tests: No results for input(s): TSH, T4TOTAL, FREET4, T3FREE, THYROIDAB in the last 72 hours. Anemia Panel: No results for input(s): VITAMINB12, FOLATE, FERRITIN, TIBC, IRON, RETICCTPCT in the last 72 hours. Urine analysis: No results found for: COLORURINE, APPEARANCEUR, [place], [place], GLUCOSEU, HGBUR, BILIRUBINUR, KETONESUR, PROTEINUR, UROBILINOGEN, NITRITE, LEUKOCYTESUR   Imaging Results:      Radiological Exams on Admission: CT Head Wo Contrast  Result Date: 01/23/2020 CLINICAL DATA:  Dizziness for several days. Vomiting and diarrhea. No reported injury. EXAM: CT HEAD WITHOUT CONTRAST TECHNIQUE: Contiguous axial images were obtained from the base of the skull through the vertex without intravenous contrast. COMPARISON:  None. FINDINGS: Brain: No evidence of parenchymal hemorrhage or extra-axial fluid collection. No mass lesion, mass effect, or midline shift. No CT  evidence of acute infarction. Cerebral volume is age appropriate. No ventriculomegaly. Vascular: No acute abnormality. Skull: No evidence of calvarial fracture. Sinuses/Orbits: The visualized paranasal sinuses are essentially clear. Other:  The mastoid air cells are unopacified. IMPRESSION: Negative head CT without contrast. No evidence of acute intracranial abnormality. Electronically Signed   By: Ilona Sorrel M.D.   On: 01/23/2020 18:17      Pecolia Marando Ginette Otto MD [facility]  If 7PM-7AM, please contact night-coverage   01/23/2020, 7:35 PM

## 2020-01-23 NOTE — ED Notes (Signed)
Transported to CT 

## 2020-01-24 ENCOUNTER — Encounter (HOSPITAL_COMMUNITY): Payer: Self-pay | Admitting: Internal Medicine

## 2020-01-24 DIAGNOSIS — R195 Other fecal abnormalities: Secondary | ICD-10-CM

## 2020-01-24 LAB — TYPE AND SCREEN
ABO/RH(D): AB POS
Antibody Screen: NEGATIVE
Unit division: 0
Unit division: 0

## 2020-01-24 LAB — BASIC METABOLIC PANEL
Anion gap: 9 (ref 5–15)
BUN: 9 mg/dL (ref 6–20)
CO2: 25 mmol/L (ref 22–32)
Calcium: 8.2 mg/dL — ABNORMAL LOW (ref 8.9–10.3)
Chloride: 106 mmol/L (ref 98–111)
Creatinine, Ser: 0.5 mg/dL (ref 0.44–1.00)
GFR calc Af Amer: >60 mL/min (ref >60)
GFR calc non Af Amer: >60 mL/min (ref >60)
Glucose, Bld: 90 mg/dL (ref 70–99)
Potassium: 3.8 mmol/L (ref 3.5–5.1)
Sodium: 140 mmol/L (ref 135–145)

## 2020-01-24 LAB — SARS CORONAVIRUS 2 (TAT 6-24 HRS): SARS Coronavirus 2: NEGATIVE

## 2020-01-24 LAB — BPAM RBC
Blood Product Expiration Date: 202104182359
Blood Product Expiration Date: 202105022359
ISSUE DATE / TIME: 202103281948
ISSUE DATE / TIME: 202103282315
Unit Type and Rh: 1700
Unit Type and Rh: 6200

## 2020-01-24 LAB — CBC
HCT: 27 % — ABNORMAL LOW (ref 36.0–46.0)
Hemoglobin: 8.5 g/dL — ABNORMAL LOW (ref 12.0–15.0)
MCH: 24.6 pg — ABNORMAL LOW (ref 26.0–34.0)
MCHC: 31.5 g/dL (ref 30.0–36.0)
MCV: 78.3 fL — ABNORMAL LOW (ref 80.0–100.0)
Platelets: 412 10*3/uL — ABNORMAL HIGH (ref 150–400)
RBC: 3.45 MIL/uL — ABNORMAL LOW (ref 3.87–5.11)
RDW: 17.2 % — ABNORMAL HIGH (ref 11.5–15.5)
WBC: 9.6 10*3/uL (ref 4.0–10.5)
nRBC: 0 % (ref 0.0–0.2)

## 2020-01-24 LAB — HIV ANTIBODY (ROUTINE TESTING W REFLEX): HIV Screen 4th Generation wRfx: NONREACTIVE

## 2020-01-24 MED ORDER — PANTOPRAZOLE SODIUM 40 MG IV SOLR
INTRAVENOUS | Status: AC
  Filled 2020-01-24: qty 160

## 2020-01-24 NOTE — Progress Notes (Signed)
PROGRESS NOTE    Natalie Washington  KS:[id number] DOB: 1966/05/26 DOA: 01/23/2020 PCP: Patient, No Pcp Per   Brief Narrative:  Per HPI: Natalie Washington is a 54 y.o. female with no significant medical history who presented to the ER with weakness, dizziness.  Patient presented to the ER with worsening weakness, dizziness over the last several days.  Was seen at an urgent care clinic and sent into the emergency department.  Was noted to have increased dizziness and worsening symptoms with ambulation which improved with rest.  Patient reports taking ibuprofen at least 3-4 times per week for headaches.  Takes up to 4 tablets a day.  Also has a history of moderate alcohol consumption but last drink was about 1 month ago.  Does report having an episode of black tarry stool today.  No prior history of GI bleed. No chest pain, fever, chills, cough, palpitations, shortness of breath. No nausea, vomiting, abdominal pain, diarrhea, dysuria No hematemesis, hematochezia as per patient  3/29: Patient was admitted with symptomatic acute blood loss anemia and has been transfused 2 units of PRBCs with improvement in hemoglobin levels at 8.5.  She states that she is quite hungry and has been started on a clear liquid diet by GI with plans for upper and lower endoscopy presumably by a.m.  Continue to follow H&H.   Assessment & Plan:   Principal Problem:   GI bleed Active Problems:   Symptomatic anemia   Symptomatic acute blood loss anemia status post 2 unit PRBC transfusion -Hemoglobin improved from 5.1->8.5 this morning with stable vital signs and no further bleeding episodes noted -Appreciate GI consultation with plans for upper and lower endoscopy by a.m. -Clear liquid diets for now and monitor repeat CBC -Continue on PPI drip   DVT prophylaxis: SCDs Code Status: Full code Family Communication: None at bedside Disposition Plan: Per GI.  Anticipate upper and lower endoscopy by a.m. and maintain  on clear liquid diet.  Potential discharge by 3/30.   Consultants:   GI  Procedures:   See below  Antimicrobials:   None   Subjective: Patient seen and evaluated today with no new acute complaints or concerns. No acute concerns or events noted overnight.  She states that she is hungry.  Objective: Vitals:   01/23/20 2257 01/23/20 2339 01/24/20 0203 01/24/20 0417  BP: 103/80 103/78 106/74 92/64  Pulse: 91 89 65 63  Resp: 18 18 18 19   Temp: (!) 100.9 F (38.3 C) 99.9 F (37.7 C) 98.2 F (36.8 C) 98.2 F (36.8 C)  TempSrc: Oral Oral Oral Oral  SpO2: 100% 93% 100% 100%  Weight:    52.4 kg  Height:        Intake/Output Summary (Last 24 hours) at 01/24/2020 1123 Last data filed at 01/24/2020 0500 Gross per 24 hour  Intake 210.56 ml  Output --  Net 210.56 ml   Filed Weights   01/23/20 1612 01/23/20 2055 01/24/20 0417  Weight: 66.7 kg 52 kg 52.4 kg    Examination:  General exam: Appears calm and comfortable  Respiratory system: Clear to auscultation. Respiratory effort normal. Cardiovascular system: S1 & S2 heard, RRR. No JVD, murmurs, rubs, gallops or clicks. No pedal edema. Gastrointestinal system: Abdomen is nondistended, soft and nontender. No organomegaly or masses felt. Normal bowel sounds heard. Central nervous system: Alert and oriented. No focal neurological deficits. Extremities: Symmetric 5 x 5 power. Skin: No rashes, lesions or ulcers Psychiatry: Judgement and insight appear normal. Mood & affect appropriate.  Data Reviewed: I have personally reviewed following labs and imaging studies  CBC: Recent Labs  Lab 01/23/20 1721 01/24/20 0451  WBC 8.3 9.6  NEUTROABS 5.8  --   HGB 5.1* 8.5*  HCT 17.3* 27.0*  MCV 74.9* 78.3*  PLT 392 123456*   Basic Metabolic Panel: Recent Labs  Lab 01/23/20 1721 01/24/20 0451  NA 139 140  K 4.0 3.8  CL 106 106  CO2 25 25  GLUCOSE 97 90  BUN 8 9  CREATININE 0.43* 0.50  CALCIUM 8.3* 8.2*    GFR: Estimated Creatinine Clearance: 60.7 mL/min (by C-G formula based on SCr of 0.5 mg/dL). Liver Function Tests: Recent Labs  Lab 01/23/20 1721  AST 41  ALT 35  ALKPHOS 275*  BILITOT 0.4  PROT 6.2*  ALBUMIN 2.4*   No results for input(s): LIPASE, AMYLASE in the last 168 hours. No results for input(s): AMMONIA in the last 168 hours. Coagulation Profile: No results for input(s): INR, PROTIME in the last 168 hours. Cardiac Enzymes: No results for input(s): CKTOTAL, CKMB, CKMBINDEX, TROPONINI in the last 168 hours. BNP (last 3 results) No results for input(s): PROBNP in the last 8760 hours. HbA1C: No results for input(s): HGBA1C in the last 72 hours. CBG: No results for input(s): GLUCAP in the last 168 hours. Lipid Profile: No results for input(s): CHOL, HDL, LDLCALC, TRIG, CHOLHDL, LDLDIRECT in the last 72 hours. Thyroid Function Tests: No results for input(s): TSH, T4TOTAL, FREET4, T3FREE, THYROIDAB in the last 72 hours. Anemia Panel: No results for input(s): VITAMINB12, FOLATE, FERRITIN, TIBC, IRON, RETICCTPCT in the last 72 hours. Sepsis Labs: No results for input(s): PROCALCITON, LATICACIDVEN in the last 168 hours.  No results found for this or any previous visit (from the past 240 hour(s)).       Radiology Studies: CT Head Wo Contrast  Result Date: 01/23/2020 CLINICAL DATA:  Dizziness for several days. Vomiting and diarrhea. No reported injury. EXAM: CT HEAD WITHOUT CONTRAST TECHNIQUE: Contiguous axial images were obtained from the base of the skull through the vertex without intravenous contrast. COMPARISON:  None. FINDINGS: Brain: No evidence of parenchymal hemorrhage or extra-axial fluid collection. No mass lesion, mass effect, or midline shift. No CT evidence of acute infarction. Cerebral volume is age appropriate. No ventriculomegaly. Vascular: No acute abnormality. Skull: No evidence of calvarial fracture. Sinuses/Orbits: The visualized paranasal sinuses are  essentially clear. Other:  The mastoid air cells are unopacified. IMPRESSION: Negative head CT without contrast. No evidence of acute intracranial abnormality. Electronically Signed   By: Ilona Sorrel M.D.   On: 01/23/2020 18:17        Scheduled Meds: . sodium chloride flush  3 mL Intravenous Q12H   Continuous Infusions: . sodium chloride    . lactated ringers 75 mL/hr at 01/24/20 0334  . pantoprozole (PROTONIX) infusion 8 mg/hr (01/24/20 0414)     LOS: 1 day    Time spent: 35 minutes    Alika Saladin D Manuella Ghazi, DO [facility]  If 7PM-7AM, please contact night-coverage [email or web address] 01/24/2020, 11:23 AM

## 2020-01-24 NOTE — Consult Note (Signed)
Referring Provider: Dr. Vivia Ewing Primary Care Physician:  Patient, No Pcp Per Primary Gastroenterologist:  Dr. Gala Romney   Date of Admission: 01/23/20 Date of Consultation: 01/24/20  Reason for Consultation:  Symptomatic anemia  HPI:  Natalie Washington is a 54 y.o. year old female presenting with weakness, dizziness, difficulty with ambulating, found to have profound anemia with Hgb 5.1. Heme positive. Hgb in Epic June 2020 normal at 12.1. Received  2 units PRBCs with Hgb improved to 8.5.   No N/V. No abdominal pain. Feels hungry and wants to eat. Dizziness resolved. No melena. No hematochezia. No weight loss or lack of appetite. No early satiety. No dysphagia. Takes Ibuprofen every other day for joint pain. Denies chest pain or shortness of breath. No cough. No reflux. No prescribed medications prior to admission. No prior colonoscopy or EGD. Drinks about once per week, about a cup of beer. Lives with her mom.   Father with colon cancer but patient is unsure how old he was; he passed away in 01-20-05.   She is adamant about eating. She is unable to consent to procedures now, stating she wants to eat and can't think about anything but this.  Past Medical History:  Diagnosis Date  . Pneumonia    1980    Past Surgical History:  Procedure Laterality Date  . ORIF HUMERUS FRACTURE Right 04/07/2019   Procedure: OPEN REDUCTION INTERNAL FIXATION (ORIF) PROXIMAL HUMERUS FRACTURE; BICEP TENODESIS;  Surgeon: Hiram Gash, MD;  Location: [facility];  Service: Orthopedics;  Laterality: Right;  . WISDOM TOOTH EXTRACTION      Prior to Admission medications   Not on File    Current Facility-Administered Medications  Medication Dose Route Frequency Provider Last Rate Last Admin  . 0.9 %  sodium chloride infusion  250 mL Intravenous PRN Lynetta Mare, MD      . acetaminophen (TYLENOL) tablet 650 mg  650 mg Oral Q6H PRN Lynetta Mare, MD   650 mg at 01/23/20 01/20/2306   Or  . acetaminophen  (TYLENOL) suppository 650 mg  650 mg Rectal Q6H PRN Lynetta Mare, MD      . bisacodyl (DULCOLAX) suppository 10 mg  10 mg Rectal Daily PRN Lynetta Mare, MD      . lactated ringers infusion   Intravenous Continuous Lynetta Mare, MD 75 mL/hr at 01/24/20 0334 New Bag at 01/24/20 0334  . ondansetron (ZOFRAN) tablet 4 mg  4 mg Oral Q6H PRN Lynetta Mare, MD       Or  . ondansetron [facility]) injection 4 mg  4 mg Intravenous Q6H PRN Lynetta Mare, MD      . pantoprazole (PROTONIX) 80 mg in sodium chloride 0.9 % 100 mL (0.8 mg/mL) infusion  8 mg/hr Intravenous Continuous Lynetta Mare, MD 10 mL/hr at 01/24/20 0414 8 mg/hr at 01/24/20 0414  . polyethylene glycol (MIRALAX / GLYCOLAX) packet 17 g  17 g Oral Daily PRN Lynetta Mare, MD      . sodium chloride flush (NS) 0.9 % injection 3 mL  3 mL Intravenous Q12H Lynetta Mare, MD   3 mL at 01/23/20 2200  . sodium chloride flush (NS) 0.9 % injection 3 mL  3 mL Intravenous PRN Lynetta Mare, MD      . traMADol Veatrice Bourbon) tablet 50 mg  50 mg Oral Q8H PRN Lynetta Mare, MD        Allergies as of 01/23/2020  . (No  Known Allergies)    Family History  Problem Relation Age of Onset  . Colon cancer Father        unsure age of onset. Passed away in 2005-02-03    Social History   Socioeconomic History  . Marital status: Single    Spouse name: Not on file  . Number of children: Not on file  . Years of education: Not on file  . Highest education level: Not on file  Occupational History  . Not on file  Tobacco Use  . Smoking status: Current Every Day Smoker    Packs/day: 0.25    Years: 15.00    Pack years: 3.75  . Smokeless tobacco: Never Used  Substance and Sexual Activity  . Alcohol use: Yes    Comment: 2-3 week  . Drug use: Never  . Sexual activity: Not on file  Other Topics Concern  . Not on file  Social History Narrative  . Not on file   Social Determinants of Health   Financial Resource Strain:   .  Difficulty of Paying Living Expenses:   Food Insecurity:   . Worried About Charity fundraiser in the Last Year:   . Arboriculturist in the Last Year:   Transportation Needs:   . Film/video editor (Medical):   Marland Kitchen Lack of Transportation (Non-Medical):   Physical Activity:   . Days of Exercise per Week:   . Minutes of Exercise per Session:   Stress:   . Feeling of Stress :   Social Connections:   . Frequency of Communication with Friends and Family:   . Frequency of Social Gatherings with Friends and Family:   . Attends Religious Services:   . Active Member of Clubs or Organizations:   . Attends Archivist Meetings:   Marland Kitchen Marital Status:   Intimate Partner Violence:   . Fear of Current or Ex-Partner:   . Emotionally Abused:   Marland Kitchen Physically Abused:   . Sexually Abused:     Review of Systems: Gen: Denies fever, chills, loss of appetite, change in weight or weight loss CV: Denies chest pain, heart palpitations, syncope, edema  Resp: Denies shortness of breath with rest, cough, wheezing GI: see HPI GU : Denies urinary burning, urinary frequency, urinary incontinence.  MS: Denies joint pain,swelling, cramping Derm: Denies rash, itching, dry skin Psych: Denies depression, anxiety,confusion, or memory loss Heme: Denies bruising, bleeding, and enlarged lymph nodes.  Physical Exam: Vital signs in last 24 hours: Temp:  [98 F (36.7 C)-100.9 F (38.3 C)] 98.2 F (36.8 C) (03/29 0417) Pulse Rate:  [63-100] 63 (03/29 0417) Resp:  [15-29] 19 (03/29 0417) BP: (92-120)/(47-81) 92/64 (03/29 0417) SpO2:  [93 %-100 %] 100 % (03/29 0417) Weight:  [52 kg-66.7 kg] 52.4 kg (03/29 0417) Last BM Date: 01/20/20 General:   Alert,  Well-developed, well-nourished, thin but not cachectic appearing, flat affect, somewhat irritable Head:  Normocephalic and atraumatic. Eyes:  Sclera clear, no icterus.   Conjunctiva pink. Ears:  Normal auditory acuity. Lungs:  Clear throughout to  auscultation.    Heart:  S1 S2 present, no murmur Abdomen:  Soft, nontender and nondistended. No masses, hepatosplenomegaly or hernias noted. Normal bowel sounds, without guarding, and without rebound.   Rectal:  Deferred until time of colonoscopy.   Msk:  Symmetrical without gross deformities. Normal posture. Extremities:  Without  edema. Neurologic:  Alert and  oriented x4 Psych:  Flat affect, irritable  Intake/Output from previous day: 03/28 0701 -  03/29 0700 In: 210.6 [I.V.:114.7; IV Piggyback:95.9] Out: -  Intake/Output this shift: No intake/output data recorded.  Lab Results: Recent Labs    01/23/20 1721 01/24/20 0451  WBC 8.3 9.6  HGB 5.1* 8.5*  HCT 17.3* 27.0*  PLT 392 412*   BMET Recent Labs    01/23/20 1721 01/24/20 0451  NA 139 140  K 4.0 3.8  CL 106 106  CO2 25 25  GLUCOSE 97 90  BUN 8 9  CREATININE 0.43* 0.50  CALCIUM 8.3* 8.2*   LFT Recent Labs    01/23/20 1721  PROT 6.2*  ALBUMIN 2.4*  AST 41  ALT 35  ALKPHOS 275*  BILITOT 0.4    Studies/Results: CT Head Wo Contrast  Result Date: 01/23/2020 CLINICAL DATA:  Dizziness for several days. Vomiting and diarrhea. No reported injury. EXAM: CT HEAD WITHOUT CONTRAST TECHNIQUE: Contiguous axial images were obtained from the base of the skull through the vertex without intravenous contrast. COMPARISON:  None. FINDINGS: Brain: No evidence of parenchymal hemorrhage or extra-axial fluid collection. No mass lesion, mass effect, or midline shift. No CT evidence of acute infarction. Cerebral volume is age appropriate. No ventriculomegaly. Vascular: No acute abnormality. Skull: No evidence of calvarial fracture. Sinuses/Orbits: The visualized paranasal sinuses are essentially clear. Other:  The mastoid air cells are unopacified. IMPRESSION: Negative head CT without contrast. No evidence of acute intracranial abnormality. Electronically Signed   By: Ilona Sorrel M.D.   On: 01/23/2020 18:17     Impression: 54 year old female presenting with symptomatic profound anemia (Hgb 5.1), previously 12 about a year ago, with heme positive stool, denying any melena or hematochezia, and no prior colonoscopy/EGD. Symptomatically improved after 2 units PRBCs, with Hgb now 8.5.   Family history notable for colon cancer in father but unsure age of onset, passing away in 2006. She also endorses use of Ibuprofen approximately every other day, and she is vague in her alcohol consumption.   With family history, along with NSAID use, would recommend colonoscopy/EGD. However, she is adamant about eating today and unable to agree to endoscopic procedures this admission as she wants to eat regular food. I discussed with her starting on only a clear liquid diet, with the potential for bowel prep if she agrees to diagnostic evaluation this admission. At time of consultation, she is undecided about inpatient procedures despite discussion of benefits and risks. Dr. Manuella Ghazi notified.    Plan: Clear liquid diet Recommend colonoscopy/EGD, with risks and benefits discussed with patient. She has not agreed for procedures at time of consultation, fixated on eating instead. I have placed on clear liquids in preparation for possible bowel prep today if she is agreeable later today.  Covid test needed: orders placed per Dr. Manuella Ghazi Protonix infusion started at time of admission yesterday: continue for now  Further recommendations to follow   Annitta Needs, PhD, ANP-BC [facility]      LOS: 1 day    01/24/2020, 11:05 AM

## 2020-01-25 ENCOUNTER — Telehealth: Payer: Self-pay | Admitting: Gastroenterology

## 2020-01-25 DIAGNOSIS — D649 Anemia, unspecified: Secondary | ICD-10-CM

## 2020-01-25 LAB — COMPREHENSIVE METABOLIC PANEL
ALT: 25 U/L (ref 0–44)
AST: 36 U/L (ref 15–41)
Albumin: 2.1 g/dL — ABNORMAL LOW (ref 3.5–5.0)
Alkaline Phosphatase: 244 U/L — ABNORMAL HIGH (ref 38–126)
Anion gap: 9 (ref 5–15)
BUN: 6 mg/dL (ref 6–20)
CO2: 22 mmol/L (ref 22–32)
Calcium: 8.1 mg/dL — ABNORMAL LOW (ref 8.9–10.3)
Chloride: 105 mmol/L (ref 98–111)
Creatinine, Ser: 0.49 mg/dL (ref 0.44–1.00)
GFR calc Af Amer: >60 mL/min (ref >60)
GFR calc non Af Amer: >60 mL/min (ref >60)
Glucose, Bld: 90 mg/dL (ref 70–99)
Potassium: 4.1 mmol/L (ref 3.5–5.1)
Sodium: 136 mmol/L (ref 135–145)
Total Bilirubin: 1.3 mg/dL — ABNORMAL HIGH (ref 0.3–1.2)
Total Protein: 5.6 g/dL — ABNORMAL LOW (ref 6.5–8.1)

## 2020-01-25 LAB — CBC
HCT: 26.1 % — ABNORMAL LOW (ref 36.0–46.0)
Hemoglobin: 8.2 g/dL — ABNORMAL LOW (ref 12.0–15.0)
MCH: 24.6 pg — ABNORMAL LOW (ref 26.0–34.0)
MCHC: 31.4 g/dL (ref 30.0–36.0)
MCV: 78.1 fL — ABNORMAL LOW (ref 80.0–100.0)
Platelets: 419 10*3/uL — ABNORMAL HIGH (ref 150–400)
RBC: 3.34 MIL/uL — ABNORMAL LOW (ref 3.87–5.11)
RDW: 17.9 % — ABNORMAL HIGH (ref 11.5–15.5)
WBC: 10.6 10*3/uL — ABNORMAL HIGH (ref 4.0–10.5)
nRBC: 0 % (ref 0.0–0.2)

## 2020-01-25 MED ORDER — PANTOPRAZOLE SODIUM 40 MG PO TBEC: 40.0000 mg | DELAYED_RELEASE_TABLET | Freq: Two times a day (BID) | ORAL | 0 refills | Status: AC

## 2020-01-25 NOTE — Progress Notes (Signed)
Patient discharged off unit. AVS given to patient and discussed discharged instructions, medications,and follow-up appointments.

## 2020-01-25 NOTE — Progress Notes (Signed)
Patient IV out this am.  Patient refuses IV restick at this time.  Patient stated she would like to discuss plans for her care before having new IV access at this time.  Patient feels as if she is ready for discharge. Explained to patient that she may need more time here in hospital, but still decided against IV this am.

## 2020-01-25 NOTE — Telephone Encounter (Signed)
Noted  

## 2020-01-25 NOTE — Progress Notes (Signed)
Subjective: No abdominal pain, brbpr, melena, nausea, vomiting, GERD symptoms, or dysphagia. Clear liquids yesterday. Nothing today. Keeps stating she is hungry and ready to eat. Was taking ibuprofen every other day. About 800 mg. No lightheadedness, dizziness, or weakness.   States she doesn't want to have procedures at this time. States she is ready to go. She hasn't eaten since Sunday and wants to eat.   Objective: Vital signs in last 24 hours: Temp:  [97.7 F (36.5 C)-100.6 F (38.1 C)] 98.2 F (36.8 C) (03/30 0515) Pulse Rate:  [77-87] 77 (03/30 0515) Resp:  [16-20] 20 (03/30 0515) BP: (105-109)/(64-78) 107/64 (03/30 0515) SpO2:  [98 %-100 %] 100 % (03/30 0515) Last BM Date: 01/20/20 General:   Alert and oriented, eager to go home, frustrated and wants to eat.   Head:  Normocephalic and atraumatic. Eyes:  No icterus, sclera clear. Conjuctiva pink.  Abdomen:  Bowel sounds present, soft, non-tender, non-distended. No HSM or hernias noted. No rebound or guarding. No masses appreciated  Extremities:  Without edema. Neurologic:  Alert and  oriented x4;  grossly normal neurologically. Skin:  Warm and dry, intact without significant lesions.  Psych: Normal mood and affect.  Intake/Output from previous day: 03/29 0701 - 03/30 0700 In: 2783.8 [P.O.:840; I.V.:1943.8] Out: -  Intake/Output this shift: No intake/output data recorded.  Lab Results: Recent Labs    01/23/20 1721 01/24/20 0451 01/25/20 0503  WBC 8.3 9.6 10.6*  HGB 5.1* 8.5* 8.2*  HCT 17.3* 27.0* 26.1*  PLT 392 412* 419*   BMET Recent Labs    01/23/20 1721 01/24/20 0451 01/25/20 0503  NA 139 140 136  K 4.0 3.8 4.1  CL 106 106 105  CO2 25 25 22   GLUCOSE 97 90 90  BUN 8 9 6   CREATININE 0.43* 0.50 0.49  CALCIUM 8.3* 8.2* 8.1*   LFT Recent Labs    01/23/20 1721 01/25/20 0503  PROT 6.2* 5.6*  ALBUMIN 2.4* 2.1*  AST 41 36  ALT 35 25  ALKPHOS 275* 244*  BILITOT 0.4 1.3*     Studies/Results: CT Head Wo Contrast  Result Date: 01/23/2020 CLINICAL DATA:  Dizziness for several days. Vomiting and diarrhea. No reported injury. EXAM: CT HEAD WITHOUT CONTRAST TECHNIQUE: Contiguous axial images were obtained from the base of the skull through the vertex without intravenous contrast. COMPARISON:  None. FINDINGS: Brain: No evidence of parenchymal hemorrhage or extra-axial fluid collection. No mass lesion, mass effect, or midline shift. No CT evidence of acute infarction. Cerebral volume is age appropriate. No ventriculomegaly. Vascular: No acute abnormality. Skull: No evidence of calvarial fracture. Sinuses/Orbits: The visualized paranasal sinuses are essentially clear. Other:  The mastoid air cells are unopacified. IMPRESSION: Negative head CT without contrast. No evidence of acute intracranial abnormality. Electronically Signed   By: Ilona Sorrel M.D.   On: 01/23/2020 18:17    Assessment: 54 year old female presenting with symptomatic profound anemia (Hgb 5.1), previously 12 about a year ago, with heme positive stool, denying any melena or hematochezia, and no prior colonoscopy/EGD. Symptomatically improved after 2 units PRBCs, with posttransfusion hemoglobin 8.5 on 01/24/2020 and stable today at 8.2.   Family history notable for colon cancer in father but unsure age of onset, passing away in 2006. She also endorses use of Ibuprofen 800 mg approximately every other day, and she is vague in her alcohol consumption.   Considering family history and chronic NSAID use, recommended colonoscopy/EGD.  Patient is adamant about not having procedures while inpatient.  States she is ready to eat and wants to go home.  I discussed with her my concerns about possible PUD or other lesions in her GI tract that may continue to bleed.  Also discussed that when we follow-up with her outpatient, her procedures would not likely be for couple of months due to our scheduling.  She again states she is  ready to go. She doesn't want to wait any longer. Discussed that she will need to take Protonix 40 mg twice daily, stop all NSAIDs, and monitor for any bright red blood per rectum, melena, lightheadedness, dizziness, presyncope, syncope and return to the ED if this occurs.  She expressed her understanding.  Also notified Dr. Manuella Ghazi that patient desires discharge.  Plan: Follow-up outpatient ASAP to discuss scheduling colonoscopy/EGD. Discharge on Protonix 40 mg twice daily 30 minutes before breakfast and dinner. Discontinue all NSAIDs including ibuprofen, Aleve, Advil, and Goody powders. Monitor for bright red blood per rectum, melena, hematemesis, weakness, lightheadedness, dizziness, presyncope, syncope, shortness of breath, chest pain, and return to the ED if the symptoms occur.   LOS: 2 days    01/25/2020, 8:51 AM   Aliene Altes, PA-C [facility]

## 2020-01-25 NOTE — Plan of Care (Signed)

## 2020-01-25 NOTE — Telephone Encounter (Signed)
Natalie Washington, patient is getting discharged in the hospital today.  Admitted with anemia.  She did not have procedures while inpatient.  We need to schedule her for office visit follow-up in the next week to discuss scheduling TCS/EGD.

## 2020-01-25 NOTE — Telephone Encounter (Signed)
Called [facility] and the nurse will tell her day and time of appointment

## 2020-01-25 NOTE — Discharge Summary (Signed)
Physician Discharge Summary  [name] [id number] DOB: [birth year] DOA: 01/23/2020  PCP: Patient, No Pcp Per  Admit date: 01/23/2020  Discharge date: 01/25/2020  Admitted From:Home  Disposition:  Home  Recommendations for Outpatient Follow-up:  1. Follow up with PCP in 1-2 weeks 2. Please obtain BMP/CBC in one week 3. Follow-up with GI in 1 week as discussed to discuss further evaluation with EGD in the outpatient setting as she has refused endoscopy while inpatient. 4. Remain on PPI twice daily for now and avoid NSAIDs  Home Health: None  Equipment/Devices: None  Discharge Condition: Stable  CODE STATUS: Full  Diet recommendation: Regular  Brief/Interim Summary: Per HPI: Natalie Washington a 54 y.o.femalewithno significant medical history who presented to the ER with weakness, dizziness. Patient presented to the ER with worsening weakness, dizziness over the last several days. Was seen at an urgent care clinic and sent into the emergency department. Was noted to have increased dizziness and worsening symptoms with ambulation which improved with rest. Patient reports taking ibuprofen at least 3-4 times per week for headaches. Takes up to 4 tablets a day. Also has a history of moderate alcohol consumption but last drink was about 1 month ago. Does report having an episode of black tarry stool today. No prior history of GI bleed. No chest pain, fever, chills, cough, palpitations, shortness of breath. No nausea, vomiting, abdominal pain, diarrhea, dysuria No hematemesis, hematochezia as per patient  3/29: Patient was admitted with symptomatic acute blood loss anemia and has been transfused 2 units of PRBCs with improvement in hemoglobin levels at 8.5.  She states that she is quite hungry and has been started on a clear liquid diet by GI with plans for upper and lower endoscopy presumably by a.m.  Continue to follow H&H.  3/30: Patient is adamant about starting  her diet and refuses any GI procedures.  Her hemoglobin levels remained stable at 8.2 this morning with no further overt bleeding identified.  She has been seen by GI with plans to follow-up in the outpatient setting in approximately 1 week.  She is to remain on PPI twice daily as prescribed and avoid any NSAIDs as well as alcohol use.  She understands this and is agreeable to follow-up as scheduled.  No other acute events noted throughout the course of this admission.  Discharge Diagnoses:  Principal Problem:   GI bleed Active Problems:   Symptomatic anemia   Heme positive stool  Principal discharge diagnosis: Symptomatic acute blood loss anemia status post 2 unit PRBC transfusion.  Discharge Instructions  Discharge Instructions    Diet - low sodium heart healthy   Complete by: As directed    Increase activity slowly   Complete by: As directed      Allergies as of 01/25/2020   No Known Allergies     Medication List    TAKE these medications   pantoprazole 40 MG tablet Commonly known as: Protonix Take 1 tablet (40 mg total) by mouth 2 (two) times daily.      Follow-up Information    GI Follow up in 1 week(s).          No Known Allergies  Consultations:  GI   Procedures/Studies: CT Head Wo Contrast  Result Date: 01/23/2020 CLINICAL DATA:  Dizziness for several days. Vomiting and diarrhea. No reported injury. EXAM: CT HEAD WITHOUT CONTRAST TECHNIQUE: Contiguous axial images were obtained from the base of the skull through the vertex without intravenous contrast. COMPARISON:  None. FINDINGS: Brain:  No evidence of parenchymal hemorrhage or extra-axial fluid collection. No mass lesion, mass effect, or midline shift. No CT evidence of acute infarction. Cerebral volume is age appropriate. No ventriculomegaly. Vascular: No acute abnormality. Skull: No evidence of calvarial fracture. Sinuses/Orbits: The visualized paranasal sinuses are essentially clear. Other:  The mastoid air  cells are unopacified. IMPRESSION: Negative head CT without contrast. No evidence of acute intracranial abnormality. Electronically Signed   By: Ilona Sorrel M.D.   On: 01/23/2020 18:17     Discharge Exam: Vitals:   01/24/20 2145 01/25/20 0515  BP: 106/70 107/64  Pulse: 83 77  Resp: 20 20  Temp: (!) 100.6 F (38.1 C) 98.2 F (36.8 C)  SpO2: 100% 100%   Vitals:   01/24/20 1412 01/24/20 1928 01/24/20 2145 01/25/20 0515  BP: 109/77  106/70 107/64  Pulse: 83  83 77  Resp: 20  20 20   Temp: 99.2 F (37.3 C)  (!) 100.6 F (38.1 C) 98.2 F (36.8 C)  TempSrc: Oral  Oral Oral  SpO2: 100% 98% 100% 100%  Weight:      Height:        General: Pt is alert, awake, not in acute distress Cardiovascular: RRR, S1/S2 +, no rubs, no gallops Respiratory: CTA bilaterally, no wheezing, no rhonchi Abdominal: Soft, NT, ND, bowel sounds + Extremities: no edema, no cyanosis    The results of significant diagnostics from this hospitalization (including imaging, microbiology, ancillary and laboratory) are listed below for reference.     Microbiology: Recent Results (from the past 240 hour(s))  SARS CORONAVIRUS 2 (TAT 6-24 HRS) Nasopharyngeal Nasopharyngeal Swab     Status: None   Collection Time: 01/24/20 11:52 AM   Specimen: Nasopharyngeal Swab  Result Value Ref Range Status   SARS Coronavirus 2 NEGATIVE NEGATIVE Final    Comment: (NOTE) SARS-CoV-2 target nucleic acids are NOT DETECTED. The SARS-CoV-2 RNA is generally detectable in upper and lower respiratory specimens during the acute phase of infection. Negative results do not preclude SARS-CoV-2 infection, do not rule out co-infections with other pathogens, and should not be used as the sole basis for treatment or other patient management decisions. Negative results must be combined with clinical observations, patient history, and epidemiological information. The expected result is Negative. Fact Sheet for  Patients: SugarRoll.be Fact Sheet for Healthcare Providers: https://www.woods-mathews.com/ This test is not yet approved or cleared by the [place] FDA and  has been authorized for detection and/or diagnosis of SARS-CoV-2 by FDA under an Emergency Use Authorization (EUA). This EUA will remain  in effect (meaning this test can be used) for the duration of the COVID-19 declaration under Section 56 4(b)(1) of the Act, 21 U.S.C. section 360bbb-3(b)(1), unless the authorization is terminated or revoked sooner. Performed at [facility], [place] [address], [place], [place] [address]      Labs: BNP (last 3 results) No results for input(s): BNP in the last 8760 hours. Basic Metabolic Panel: Recent Labs  Lab 01/23/20 1721 01/24/20 0451 01/25/20 0503  NA 139 140 136  K 4.0 3.8 4.1  CL 106 106 105  CO2 25 25 22   GLUCOSE 97 90 90  BUN 8 9 6   CREATININE 0.43* 0.50 0.49  CALCIUM 8.3* 8.2* 8.1*   Liver Function Tests: Recent Labs  Lab 01/23/20 1721 01/25/20 0503  AST 41 36  ALT 35 25  ALKPHOS 275* 244*  BILITOT 0.4 1.3*  PROT 6.2* 5.6*  ALBUMIN 2.4* 2.1*   No results for input(s):  LIPASE, AMYLASE in the last 168 hours. No results for input(s): AMMONIA in the last 168 hours. CBC: Recent Labs  Lab 01/23/20 1721 01/24/20 0451 01/25/20 0503  WBC 8.3 9.6 10.6*  NEUTROABS 5.8  --   --   HGB 5.1* 8.5* 8.2*  HCT 17.3* 27.0* 26.1*  MCV 74.9* 78.3* 78.1*  PLT 392 412* 419*   Cardiac Enzymes: No results for input(s): CKTOTAL, CKMB, CKMBINDEX, TROPONINI in the last 168 hours. BNP: Invalid input(s): POCBNP CBG: No results for input(s): GLUCAP in the last 168 hours. D-Dimer No results for input(s): DDIMER in the last 72 hours. Hgb A1c No results for input(s): HGBA1C in the last 72 hours. Lipid Profile No results for input(s): CHOL, HDL, LDLCALC, TRIG, CHOLHDL, LDLDIRECT in the last 72 hours. Thyroid function studies No  results for input(s): TSH, T4TOTAL, T3FREE, THYROIDAB in the last 72 hours.  Invalid input(s): FREET3 Anemia work up No results for input(s): VITAMINB12, FOLATE, FERRITIN, TIBC, IRON, RETICCTPCT in the last 72 hours. Urinalysis No results found for: COLORURINE, APPEARANCEUR, [place], [place], [place], [place], [place], [place], PROTEINUR, UROBILINOGEN, NITRITE, LEUKOCYTESUR Sepsis Labs Invalid input(s): PROCALCITONIN,  WBC,  LACTICIDVEN Microbiology Recent Results (from the past 240 hour(s))  SARS CORONAVIRUS 2 (TAT 6-24 HRS) Nasopharyngeal Nasopharyngeal Swab     Status: None   Collection Time: 01/24/20 11:52 AM   Specimen: Nasopharyngeal Swab  Result Value Ref Range Status   SARS Coronavirus 2 NEGATIVE NEGATIVE Final    Comment: (NOTE) SARS-CoV-2 target nucleic acids are NOT DETECTED. The SARS-CoV-2 RNA is generally detectable in upper and lower respiratory specimens during the acute phase of infection. Negative results do not preclude SARS-CoV-2 infection, do not rule out co-infections with other pathogens, and should not be used as the sole basis for treatment or other patient management decisions. Negative results must be combined with clinical observations, patient history, and epidemiological information. The expected result is Negative. Fact Sheet for Patients: SugarRoll.be Fact Sheet for Healthcare Providers: https://www.woods-mathews.com/ This test is not yet approved or cleared by the [place] FDA and  has been authorized for detection and/or diagnosis of SARS-CoV-2 by FDA under an Emergency Use Authorization (EUA). This EUA will remain  in effect (meaning this test can be used) for the duration of the COVID-19 declaration under Section 56 4(b)(1) of the Act, 21 U.S.C. section 360bbb-3(b)(1), unless the authorization is terminated or revoked sooner. Performed at [facility], [place] [address] [place],  [place] [address]      Time coordinating discharge: 35 minutes  SIGNED:   Rodena Goldmann, DO [facility] 01/25/2020, 10:18 AM  If 7PM-7AM, please contact night-coverage [email or web address]

## 2020-02-03 NOTE — Progress Notes (Signed)
Referring Provider: No ref. provider found Primary Care Physician:  Patient, No Pcp Per Primary GI Physician: Dr. Gala Romney  Chief Complaint  Patient presents with  . Hospitalization Follow-up    HPI:   Natalie Washington is a 54 y.o. female presenting for hospital follow-up.   She was admitted from 01/23/20-01/25/20 for symptomatic anemia found to have hemoglobin 5.1, previously 12 about a year ago. Also with heme positive stool but denying melena or hematochezia. No prior colonoscopy/EGD. Symptomatically improved after 2 units PRBCs, with posttransfusion hemoglobin 8.5 on 01/24/2020 and stable at 8.2 on day of discharge. Family history notable for colon cancer in father but unsure age of onset, passing away in 06-Feb-2005. She also endorses use of Ibuprofen 800 mg approximately every other day, vague in her alcohol consumption. It was recommended for patient to have TCS/EGD while inpatient but she declined and desired to follow-up in clinic to arrange. She was discharged on Protonix BID, advised to avoid all NSAIDs, and monitor for bright red blood per rectum, melena, lightheadedness, dizziness, presyncope, syncope and return to the ED if this occurs.  Today:  No bright red blood per rectum or melena. Stools is brown.  Denies constipation or diarrhea. Wanting to know if she can eat regular foods again. She has been following a mostly liquid diet.  No abdominal pain, N/V, GERD symptoms, or dysphagia.   Drinking plenty of water. No lightheadedness, dizziness, pre-syncope, or syncope. Weight has been stable. Taking Protonix BID.    Stopped ibuprofen. No NSAIDs.   Drinks 1 beer a week.   Past Medical History:  Diagnosis Date  . Pneumonia    1980    Past Surgical History:  Procedure Laterality Date  . ORIF HUMERUS FRACTURE Right 04/07/2019   Procedure: OPEN REDUCTION INTERNAL FIXATION (ORIF) PROXIMAL HUMERUS FRACTURE; BICEP TENODESIS;  Surgeon: Hiram Gash, MD;  Location: [facility];  Service:  Orthopedics;  Laterality: Right;  . WISDOM TOOTH EXTRACTION      Current Outpatient Medications  Medication Sig Dispense Refill  . pantoprazole (PROTONIX) 40 MG tablet Take 1 tablet (40 mg total) by mouth 2 (two) times daily. 120 tablet 0   No current facility-administered medications for this visit.    Allergies as of 02/04/2020  . (No Known Allergies)    Family History  Problem Relation Age of Onset  . Colon cancer Father        unsure age of onset. Passed away in 06-Feb-2005    Social History   Socioeconomic History  . Marital status: Single    Spouse name: Not on file  . Number of children: Not on file  . Years of education: Not on file  . Highest education level: Not on file  Occupational History  . Not on file  Tobacco Use  . Smoking status: Current Every Day Smoker    Packs/day: 0.25    Years: 15.00    Pack years: 3.75  . Smokeless tobacco: Never Used  Substance and Sexual Activity  . Alcohol use: Yes    Comment: once a week  . Drug use: Never  . Sexual activity: Not on file  Other Topics Concern  . Not on file  Social History Narrative  . Not on file   Social Determinants of Health   Financial Resource Strain:   . Difficulty of Paying Living Expenses:   Food Insecurity:   . Worried About Charity fundraiser in the Last Year:   . [employer]  in the Last Year:   Transportation Needs:   . Film/video editor (Medical):   Marland Kitchen Lack of Transportation (Non-Medical):   Physical Activity:   . Days of Exercise per Week:   . Minutes of Exercise per Session:   Stress:   . Feeling of Stress :   Social Connections:   . Frequency of Communication with Friends and Family:   . Frequency of Social Gatherings with Friends and Family:   . Attends Religious Services:   . Active Member of Clubs or Organizations:   . Attends Archivist Meetings:   Marland Kitchen Marital Status:     Review of Systems: Gen: Denies fever, chills, cold or flulike symptoms. CV: Denies  chest pain or heart palpitations. Resp: Denies shortness of breath or cough GI: See HPI Derm: Denies rash Psych: Denies depression or anxiety Heme: See HPI  Physical Exam: BP 104/73   Pulse 96   Temp (!) 96.9 F (36.1 C) (Temporal)   Ht 5\' 1"  (1.549 m)   Wt 116 lb 12.8 oz (53 kg)   BMI 22.07 kg/m  General:   Alert and oriented. No distress noted. Pleasant and cooperative.  Head:  Normocephalic and atraumatic. Eyes:  Conjuctiva clear without scleral icterus. Heart:  S1, S2 present without murmurs appreciated. Lungs:  Clear to auscultation bilaterally. No wheezes, rales, or rhonchi. No distress.  Abdomen:  +BS, soft, non-tender and non-distended. No rebound or guarding. No HSM or masses noted. Msk:  Symmetrical without gross deformities. Normal posture. Extremities:  Without edema. Neurologic:  Alert and  oriented x4 Psych: Normal mood and affect.

## 2020-02-04 ENCOUNTER — Ambulatory Visit (INDEPENDENT_AMBULATORY_CARE_PROVIDER_SITE_OTHER): Payer: Self-pay | Admitting: Gastroenterology

## 2020-02-04 ENCOUNTER — Other Ambulatory Visit: Payer: Self-pay | Admitting: *Deleted

## 2020-02-04 ENCOUNTER — Other Ambulatory Visit: Payer: Self-pay

## 2020-02-04 ENCOUNTER — Encounter: Payer: Self-pay | Admitting: Gastroenterology

## 2020-02-04 ENCOUNTER — Encounter: Payer: Self-pay | Admitting: *Deleted

## 2020-02-04 VITALS — BP 104/73 | HR 96 | Temp 96.9°F | Ht 61.0 in | Wt 116.8 lb

## 2020-02-04 DIAGNOSIS — D649 Anemia, unspecified: Secondary | ICD-10-CM

## 2020-02-04 NOTE — Patient Instructions (Signed)
We will get you scheduled for an upper endoscopy and a colonoscopy in the near future with Dr. Buford Dresser.  Please have labs completed in 1 week.  This is to check on your hemoglobin as well as your iron levels.  Continue Protonix 40 mg twice daily 30 minutes before breakfast and dinner.  Please let me know if you need refills on this.  Continue to monitor for any bright red blood per rectum or black tarry stools and let us know immediately if this occurs.  It is okay follow a regular diet.  No specific dietary restrictions.  We will follow up with you after your procedures.  Aliene Altes, PA-C [facility]

## 2020-02-05 NOTE — Assessment & Plan Note (Signed)
54 year old female who was recently hospitalized from 01/23/2020-01/25/2020 for symptomatic anemia found to have hemoglobin of 5.1 on admission, previously 12 about 1 year ago.  Also heme positive stool but denied melena or hematochezia.  No prior TCS/EGD.  Received 2 units PRBCs with posttransfusion hemoglobin 8.5 on 01/24/2020 and stable at 8.2 on the day of discharge.  Family history significant for father with colon cancer but unsure of age of onset.  She also endorsed 100 mg ibuprofen every other day.  She declined procedures while inpatient and was discharged on Protonix twice daily.  She is currently doing well taking Protonix twice daily and has stopped all NSAIDs.  Denies any significant upper or lower GI symptoms.  No bright red blood per rectum or melena.    Differential for heme positive stool with anemia is broad with blood loss essentially coming from anywhere in the GI tract.  With NSAID use, concerns for upper GI etiology such as PUD, gastritis, duodenitis.  May also have AVMs, colon polyps, and cannot rule out malignancy.  She needs TCS/EGD for further evaluation.  We will also plan to update her CBC and obtain an iron panel in 1 week (2 weeks post discharge).   CBC and iron panel in 1 week. Proceed with TCS/EGD with Dr. Gala Romney in the near future. The risks, benefits, and alternatives have been discussed in detail with patient. They have stated understanding and desire to proceed.  Continue Protonix twice daily for now. Avoid all NSAIDs. Monitor for bright red blood per rectum or melena and notify us know immediately. Follow-up after procedures.

## 2020-02-07 NOTE — Progress Notes (Signed)
No pcp per pt °

## 2020-02-12 LAB — CBC WITH DIFFERENTIAL/PLATELET
Absolute Monocytes: 1462 cells/uL — ABNORMAL HIGH (ref 200–950)
Basophils Absolute: 101 cells/uL (ref 0–200)
Basophils Relative: 0.8 %
Eosinophils Absolute: 126 cells/uL (ref 15–500)
Eosinophils Relative: 1 %
HCT: 29.4 % — ABNORMAL LOW (ref 35.0–45.0)
Hemoglobin: 9.1 g/dL — ABNORMAL LOW (ref 11.7–15.5)
Lymphs Abs: 2066 cells/uL (ref 850–3900)
MCH: 24.3 pg — ABNORMAL LOW (ref 27.0–33.0)
MCHC: 31 g/dL — ABNORMAL LOW (ref 32.0–36.0)
MCV: 78.4 fL — ABNORMAL LOW (ref 80.0–100.0)
MPV: 9 fL (ref 7.5–12.5)
Monocytes Relative: 11.6 %
Neutro Abs: 8845 cells/uL — ABNORMAL HIGH (ref 1500–7800)
Neutrophils Relative %: 70.2 %
Platelets: 648 10*3/uL — ABNORMAL HIGH (ref 140–400)
RBC: 3.75 10*6/uL — ABNORMAL LOW (ref 3.80–5.10)
RDW: 18.3 % — ABNORMAL HIGH (ref 11.0–15.0)
Total Lymphocyte: 16.4 %
WBC: 12.6 10*3/uL — ABNORMAL HIGH (ref 3.8–10.8)

## 2020-02-12 LAB — IRON,TIBC AND FERRITIN PANEL
%SAT: 8 % (calc) — ABNORMAL LOW (ref 16–45)
Ferritin: 74 ng/mL (ref 16–232)
Iron: 13 ug/dL — ABNORMAL LOW (ref 45–160)
TIBC: 170 mcg/dL (calc) — ABNORMAL LOW (ref 250–450)

## 2020-02-13 NOTE — Progress Notes (Signed)
Hemoglobin remains low but has improved from 8.2 on the day of hospital discharge to 9.1. She is iron deficient likely secondary to occult blood loss. She needs to start oral iron daily (containing 325 mg ferrous sulfate) for the next 8 weeks and we can recheck her iron panel at that time.   *With starting iron, it is very important that she hold iron 7 days prior to her TCS/EGD.  *Please arrange iron panel with ferritin.   Additionally: WBC are elevated at 12.6 and platelets are elevated at 648. She had no signs or symptoms of infection when I saw her in the office. This could be reactive related to an inflammatory process (possibly related to the cause of her GI bleed which we are evaluating with TCS/EGD).  She needs to continue taking her PPI BID, avoid all NSAIDs and monitor for any abdominal pain, N/V and let us know if this occurs. Otherwise, we will proceed with procedures as planned. She should also get established with a PCP to have WBC/platelets followed/evaluated further.

## 2020-02-21 ENCOUNTER — Other Ambulatory Visit: Payer: Self-pay

## 2020-02-21 DIAGNOSIS — D509 Iron deficiency anemia, unspecified: Secondary | ICD-10-CM

## 2020-03-02 ENCOUNTER — Telehealth: Payer: Self-pay | Admitting: Gastroenterology

## 2020-03-02 NOTE — Telephone Encounter (Signed)
I recently started patient on iron (see result note 02/11/20). She is scheduled for procedures on 03/14/20. Could you remind her about holding iron x 7 days prior to procedures as this was not part of her original instructions?

## 2020-03-06 NOTE — Telephone Encounter (Signed)
Called patient and aware will stop iron 03/07/2020. She will mark this down.

## 2020-03-08 ENCOUNTER — Encounter (HOSPITAL_COMMUNITY): Payer: Self-pay | Admitting: Emergency Medicine

## 2020-03-08 ENCOUNTER — Other Ambulatory Visit: Payer: Self-pay

## 2020-03-08 ENCOUNTER — Inpatient Hospital Stay (HOSPITAL_COMMUNITY)
Admission: EM | Admit: 2020-03-08 | Discharge: 2020-03-10 | DRG: 375 | Disposition: A | Discharge status: Home / Self Care | Payer: Medicaid Other | Attending: Internal Medicine | Admitting: Internal Medicine

## 2020-03-08 ENCOUNTER — Emergency Department (HOSPITAL_COMMUNITY): Payer: Medicaid Other

## 2020-03-08 DIAGNOSIS — K5900 Constipation, unspecified: Secondary | ICD-10-CM | POA: Diagnosis present

## 2020-03-08 DIAGNOSIS — R188 Other ascites: Secondary | ICD-10-CM | POA: Diagnosis present

## 2020-03-08 DIAGNOSIS — K635 Polyp of colon: Secondary | ICD-10-CM | POA: Diagnosis present

## 2020-03-08 DIAGNOSIS — Z20822 Contact with and (suspected) exposure to covid-19: Secondary | ICD-10-CM | POA: Diagnosis present

## 2020-03-08 DIAGNOSIS — Z800 Family history of malignant neoplasm of digestive organs: Secondary | ICD-10-CM | POA: Diagnosis not present

## 2020-03-08 DIAGNOSIS — E876 Hypokalemia: Secondary | ICD-10-CM | POA: Diagnosis present

## 2020-03-08 DIAGNOSIS — I700 Atherosclerosis of aorta: Secondary | ICD-10-CM | POA: Diagnosis present

## 2020-03-08 DIAGNOSIS — K5730 Diverticulosis of large intestine without perforation or abscess without bleeding: Secondary | ICD-10-CM | POA: Diagnosis present

## 2020-03-08 DIAGNOSIS — R599 Enlarged lymph nodes, unspecified: Secondary | ICD-10-CM | POA: Diagnosis present

## 2020-03-08 DIAGNOSIS — C787 Secondary malignant neoplasm of liver and intrahepatic bile duct: Secondary | ICD-10-CM

## 2020-03-08 DIAGNOSIS — E43 Unspecified severe protein-calorie malnutrition: Secondary | ICD-10-CM | POA: Insufficient documentation

## 2020-03-08 DIAGNOSIS — K219 Gastro-esophageal reflux disease without esophagitis: Secondary | ICD-10-CM | POA: Diagnosis present

## 2020-03-08 DIAGNOSIS — Z8701 Personal history of pneumonia (recurrent): Secondary | ICD-10-CM

## 2020-03-08 DIAGNOSIS — C183 Malignant neoplasm of hepatic flexure: Secondary | ICD-10-CM | POA: Diagnosis present

## 2020-03-08 DIAGNOSIS — D500 Iron deficiency anemia secondary to blood loss (chronic): Secondary | ICD-10-CM | POA: Diagnosis present

## 2020-03-08 DIAGNOSIS — F1010 Alcohol abuse, uncomplicated: Secondary | ICD-10-CM | POA: Diagnosis present

## 2020-03-08 DIAGNOSIS — Z79899 Other long term (current) drug therapy: Secondary | ICD-10-CM

## 2020-03-08 DIAGNOSIS — K6389 Other specified diseases of intestine: Secondary | ICD-10-CM

## 2020-03-08 DIAGNOSIS — N390 Urinary tract infection, site not specified: Secondary | ICD-10-CM | POA: Diagnosis present

## 2020-03-08 DIAGNOSIS — E8809 Other disorders of plasma-protein metabolism, not elsewhere classified: Secondary | ICD-10-CM | POA: Diagnosis present

## 2020-03-08 DIAGNOSIS — D630 Anemia in neoplastic disease: Secondary | ICD-10-CM | POA: Diagnosis present

## 2020-03-08 DIAGNOSIS — D649 Anemia, unspecified: Secondary | ICD-10-CM

## 2020-03-08 DIAGNOSIS — F17210 Nicotine dependence, cigarettes, uncomplicated: Secondary | ICD-10-CM | POA: Diagnosis present

## 2020-03-08 DIAGNOSIS — K625 Hemorrhage of anus and rectum: Secondary | ICD-10-CM | POA: Diagnosis present

## 2020-03-08 HISTORY — DX: Gastro-esophageal reflux disease without esophagitis: K21.9

## 2020-03-08 LAB — COMPREHENSIVE METABOLIC PANEL
ALT: 17 U/L (ref 0–44)
AST: 61 U/L — ABNORMAL HIGH (ref 15–41)
Albumin: 2 g/dL — ABNORMAL LOW (ref 3.5–5.0)
Alkaline Phosphatase: 380 U/L — ABNORMAL HIGH (ref 38–126)
Anion gap: 15 (ref 5–15)
BUN: 8 mg/dL (ref 6–20)
CO2: 27 mmol/L (ref 22–32)
Calcium: 7.9 mg/dL — ABNORMAL LOW (ref 8.9–10.3)
Chloride: 94 mmol/L — ABNORMAL LOW (ref 98–111)
Creatinine, Ser: 0.41 mg/dL — ABNORMAL LOW (ref 0.44–1.00)
GFR calc Af Amer: >60 mL/min (ref >60)
GFR calc non Af Amer: >60 mL/min (ref >60)
Glucose, Bld: 109 mg/dL — ABNORMAL HIGH (ref 70–99)
Potassium: 2.6 mmol/L — CL (ref 3.5–5.1)
Sodium: 136 mmol/L (ref 135–145)
Total Bilirubin: 1.2 mg/dL (ref 0.3–1.2)
Total Protein: 6.3 g/dL — ABNORMAL LOW (ref 6.5–8.1)

## 2020-03-08 LAB — CBC WITH DIFFERENTIAL/PLATELET
Abs Immature Granulocytes: 0.07 10*3/uL (ref 0.00–0.07)
Basophils Absolute: 0.1 10*3/uL (ref 0.0–0.1)
Basophils Relative: 0 %
Eosinophils Absolute: 0.1 10*3/uL (ref 0.0–0.5)
Eosinophils Relative: 0 %
HCT: 26.8 % — ABNORMAL LOW (ref 36.0–46.0)
Hemoglobin: 7.8 g/dL — ABNORMAL LOW (ref 12.0–15.0)
Immature Granulocytes: 1 %
Lymphocytes Relative: 15 %
Lymphs Abs: 1.7 10*3/uL (ref 0.7–4.0)
MCH: 24.4 pg — ABNORMAL LOW (ref 26.0–34.0)
MCHC: 29.1 g/dL — ABNORMAL LOW (ref 30.0–36.0)
MCV: 83.8 fL (ref 80.0–100.0)
Monocytes Absolute: 0.9 10*3/uL (ref 0.1–1.0)
Monocytes Relative: 8 %
Neutro Abs: 9 10*3/uL — ABNORMAL HIGH (ref 1.7–7.7)
Neutrophils Relative %: 76 %
Platelets: 384 10*3/uL (ref 150–400)
RBC: 3.2 MIL/uL — ABNORMAL LOW (ref 3.87–5.11)
RDW: 23.6 % — ABNORMAL HIGH (ref 11.5–15.5)
WBC: 11.8 10*3/uL — ABNORMAL HIGH (ref 4.0–10.5)
nRBC: 0.2 % (ref 0.0–0.2)

## 2020-03-08 LAB — PROTIME-INR
INR: 1.1 (ref 0.8–1.2)
Prothrombin Time: 14.1 seconds (ref 11.4–15.2)

## 2020-03-08 LAB — MAGNESIUM: Magnesium: 2.4 mg/dL (ref 1.7–2.4)

## 2020-03-08 LAB — URINALYSIS, ROUTINE W REFLEX MICROSCOPIC
Glucose, UA: NEGATIVE mg/dL
Hgb urine dipstick: NEGATIVE
Ketones, ur: NEGATIVE mg/dL
Nitrite: NEGATIVE
Protein, ur: 30 mg/dL — AB
Specific Gravity, Urine: 1.024 (ref 1.005–1.030)
WBC, UA: >50 WBC/hpf — ABNORMAL HIGH (ref 0–5)
pH: 5 (ref 5.0–8.0)

## 2020-03-08 LAB — SARS CORONAVIRUS 2 BY RT PCR (HOSPITAL ORDER, PERFORMED IN ~~LOC~~ HOSPITAL LAB): SARS Coronavirus 2: NEGATIVE

## 2020-03-08 LAB — PREPARE RBC (CROSSMATCH)

## 2020-03-08 LAB — LACTATE DEHYDROGENASE: LDH: 2164 U/L — ABNORMAL HIGH (ref 98–192)

## 2020-03-08 LAB — PHOSPHORUS: Phosphorus: 2.1 mg/dL — ABNORMAL LOW (ref 2.5–4.6)

## 2020-03-08 LAB — LACTIC ACID, PLASMA: Lactic Acid, Venous: 4.9 mmol/L (ref 0.5–1.9)

## 2020-03-08 LAB — LIPASE, BLOOD: Lipase: 16 U/L (ref 11–51)

## 2020-03-08 MED ORDER — POTASSIUM CHLORIDE IN NACL 20-0.9 MEQ/L-% IV SOLN
INTRAVENOUS | Status: DC
  Filled 2020-03-08: qty 1000

## 2020-03-08 MED ORDER — IOHEXOL 300 MG/ML  SOLN
100.0000 mL | Freq: Once | INTRAMUSCULAR | Status: AC | PRN
  Administered 2020-03-08: 75 mL via INTRAVENOUS

## 2020-03-08 MED ORDER — POTASSIUM CHLORIDE 10 MEQ/100ML IV SOLN: 10.0000 meq | Freq: Once | INTRAVENOUS | Status: DC

## 2020-03-08 MED ORDER — BISACODYL 10 MG RE SUPP: 10.0000 mg | Freq: Every day | RECTAL | Status: DC | PRN

## 2020-03-08 MED ORDER — ONDANSETRON HCL 4 MG/2ML IJ SOLN: 4.0000 mg | Freq: Four times a day (QID) | INTRAMUSCULAR | Status: DC | PRN

## 2020-03-08 MED ORDER — ONDANSETRON HCL 4 MG PO TABS: 4.0000 mg | ORAL_TABLET | Freq: Four times a day (QID) | ORAL | Status: DC | PRN

## 2020-03-08 MED ORDER — ACETAMINOPHEN 650 MG RE SUPP: 650.0000 mg | Freq: Four times a day (QID) | RECTAL | Status: DC | PRN

## 2020-03-08 MED ORDER — METRONIDAZOLE IN NACL 5-0.79 MG/ML-% IV SOLN
500.0000 mg | Freq: Three times a day (TID) | INTRAVENOUS | Status: DC
  Administered 2020-03-08 – 2020-03-09 (×2): 500 mg via INTRAVENOUS
  Filled 2020-03-08 (×2): qty 100

## 2020-03-08 MED ORDER — SODIUM CHLORIDE 0.9 % IV BOLUS
500.0000 mL | Freq: Once | INTRAVENOUS | Status: AC
  Administered 2020-03-08: 500 mL via INTRAVENOUS

## 2020-03-08 MED ORDER — POTASSIUM CHLORIDE 10 MEQ/100ML IV SOLN
10.0000 meq | INTRAVENOUS | Status: AC
  Administered 2020-03-08 (×2): 10 meq via INTRAVENOUS
  Filled 2020-03-08 (×2): qty 100

## 2020-03-08 MED ORDER — ACETAMINOPHEN 325 MG PO TABS
650.0000 mg | ORAL_TABLET | Freq: Four times a day (QID) | ORAL | Status: DC | PRN
  Administered 2020-03-08: 650 mg via ORAL
  Filled 2020-03-08: qty 2

## 2020-03-08 MED ORDER — PEG 3350-KCL-NA BICARB-NACL 420 G PO SOLR
4000.0000 mL | Freq: Once | ORAL | Status: AC
  Administered 2020-03-08: 4000 mL via ORAL
  Filled 2020-03-08: qty 4000

## 2020-03-08 MED ORDER — K PHOS MONO-SOD PHOS DI & MONO 155-852-130 MG PO TABS
500.0000 mg | ORAL_TABLET | Freq: Two times a day (BID) | ORAL | Status: AC
  Administered 2020-03-08: 500 mg via ORAL
  Filled 2020-03-08 (×3): qty 2

## 2020-03-08 MED ORDER — PANTOPRAZOLE SODIUM 40 MG PO TBEC
40.0000 mg | DELAYED_RELEASE_TABLET | Freq: Two times a day (BID) | ORAL | Status: DC
  Administered 2020-03-08 – 2020-03-10 (×3): 40 mg via ORAL
  Filled 2020-03-08 (×3): qty 1

## 2020-03-08 MED ORDER — POTASSIUM CHLORIDE CRYS ER 20 MEQ PO TBCR
40.0000 meq | EXTENDED_RELEASE_TABLET | ORAL | Status: AC
  Administered 2020-03-08 (×2): 40 meq via ORAL
  Filled 2020-03-08 (×2): qty 2

## 2020-03-08 MED ORDER — SODIUM CHLORIDE 0.9% IV SOLUTION: Freq: Once | INTRAVENOUS | Status: AC

## 2020-03-08 MED ORDER — SODIUM CHLORIDE 0.9 % IV BOLUS
1000.0000 mL | Freq: Once | INTRAVENOUS | Status: AC
  Administered 2020-03-08: 1000 mL via INTRAVENOUS

## 2020-03-08 MED ORDER — POTASSIUM CHLORIDE 10 MEQ/100ML IV SOLN
10.0000 meq | INTRAVENOUS | Status: AC
  Administered 2020-03-08 (×3): 10 meq via INTRAVENOUS
  Filled 2020-03-08 (×3): qty 100

## 2020-03-08 MED ORDER — SODIUM CHLORIDE 0.9 % IV SOLN
2.0000 g | INTRAVENOUS | Status: DC
  Administered 2020-03-08 – 2020-03-09 (×2): 2 g via INTRAVENOUS
  Filled 2020-03-08 (×2): qty 20

## 2020-03-08 MED ORDER — PIPERACILLIN-TAZOBACTAM 3.375 G IVPB
3.3750 g | Freq: Once | INTRAVENOUS | Status: AC
  Administered 2020-03-08: 3.375 g via INTRAVENOUS
  Filled 2020-03-08: qty 50

## 2020-03-08 MED ORDER — MAGNESIUM SULFATE 2 GM/50ML IV SOLN
2.0000 g | Freq: Once | INTRAVENOUS | Status: DC
  Filled 2020-03-08: qty 50

## 2020-03-08 MED ORDER — IOHEXOL 9 MG/ML PO SOLN
ORAL | Status: AC
  Filled 2020-03-08: qty 1000

## 2020-03-08 NOTE — ED Notes (Signed)
Date and time results received: 03/08/20  1209 (use smartphrase ".now" to insert current time)  Test: potassium Critical Value: 2.6  Name of Provider Notified: Evalee Jefferson PA  Orders Received? Or Actions Taken?: n/a

## 2020-03-08 NOTE — H&P (Signed)
TRH H&P   Patient Demographics:    Natalie Washington, is a 54 y.o. female  MRN: [id number]   DOB - 10/21/1966  Admit Date - 03/08/2020  Outpatient Primary MD for the patient is Patient, No Pcp Per  Referring MD/NP/PA: PA Idol  Patient coming from: Home  Chief Complaint  Patient presents with  . Abdominal Pain      HPI:    Natalie Washington  is a 54 y.o. female, history of alcohol abuse, tobacco abuse, no further alcohol consumption since March of this year, with recent hospitalization secondary to profound anemia thought to be secondary to GI bleed, with hemoglobin of 5.1, where she was transfused 2 units with good response, she did refuse endoscopy/colonoscopy during that hospital stay, was scheduled for outpatient endoscopy/colonoscopy 5/18, she presents today secondary to complaints of abdominal fullness, pain, and distention, and lower extremity edema, with some nausea, but no vomiting, she reports she is moving gas, no bowel movement for last couple days, has any rectal pain, or bright red blood per rectum, she denies dysuria or polyuria, reports history of colon malignancy and and father in his mid 32s. - in ED CT abdomen pelvis was significant for nonobstructive colon mass, with evidence of liver mets, and intrahepatic bile related duct dilation, but normal common bile duct in size, her work-up significant for severe hypokalemia at 2.6, and low phosphorus at 2.1, with anemia of 7.8, her LDH was elevated at 2100, lactic acid at 4.9, she had significant pyuria, but denies any dysuria or polyuria.    Review of systems:    In addition to the HPI above,  No Fever-chills,poor appetite, fatigue. No Headache, No changes with Vision or hearing, No problems swallowing food or Liquids, No Chest pain, Cough or Shortness of Breath, She reports  Abdominal pain,fullness,Nausea, No vomiting  , no diarrhea or constipation . No Blood in stool or Urine, No dysuria, No new skin rashes or bruises, No new joints pains-aches,  No new weakness, tingling, numbness in any extremity, No recent weight gain or loss, No polyuria, polydypsia or polyphagia, No significant Mental Stressors.  A full 10 point Review of Systems was done, except as stated above, all other Review of Systems were negative.   With Past History of the following :    Past Medical History:  Diagnosis Date  . Acid reflux   . Pneumonia    1980      Past Surgical History:  Procedure Laterality Date  . ORIF HUMERUS FRACTURE Right 04/07/2019   Procedure: OPEN REDUCTION INTERNAL FIXATION (ORIF) PROXIMAL HUMERUS FRACTURE; BICEP TENODESIS;  Surgeon: Hiram Gash, MD;  Location: [facility];  Service: Orthopedics;  Laterality: Right;  . WISDOM TOOTH EXTRACTION        Social History:     Social History   Tobacco Use  . Smoking status: Current Every Day Smoker  Packs/day: 0.25    Years: 15.00    Pack years: 3.75  . Smokeless tobacco: Never Used  Substance Use Topics  . Alcohol use: Yes    Comment: once a week      Family History :     Family History  Problem Relation Age of Onset  . Colon cancer Father        unsure age of onset. Passed away in January 23, 2005     Home Medications:   Prior to Admission medications   Medication Sig Start Date End Date Taking? Authorizing Provider  pantoprazole (PROTONIX) 40 MG tablet Take 1 tablet (40 mg total) by mouth 2 (two) times daily. 01/25/20 03/25/20 Yes Shah, Pratik D, DO     Allergies:    No Known Allergies   Physical Exam:   Vitals  Blood pressure 105/81, pulse (!) 102, temperature 97.9 F (36.6 C), temperature source Oral, resp. rate (!) 30, height 5\' 1"  (1.549 m), weight 52.6 kg, SpO2 100 %.   1. General well developed female, laying in bed in mild discomfort due to abdominal fullness.  2. Normal affect and insight, Not Suicidal or Homicidal, Awake  Alert, Oriented X 3.  3. No F.N deficits, ALL C.Nerves Intact, Strength 5/5 all 4 extremities, Sensation intact all 4 extremities, Plantars down going.  4. Ears and Eyes appear Normal, Conjunctivae clear, PERRLA. Moist Oral Mucosa.  5. Supple Neck, No JVD, No cervical lymphadenopathy appriciated, No Carotid Bruits.  6. Symmetrical Chest wall movement, Good air movement bilaterally, CTAB.  7. RRR, No Gallops, Rubs or Murmurs, No Parasternal Heave.  +2 lower extremity edema.  8. Positive Bowel Sounds, abdomen is mildly distended, with some discomfort, but no tenderness, rebound or guarding.  9.  No Cyanosis, Normal Skin Turgor, No Skin Rash or Bruise.  10. Good muscle tone,  joints appear normal , no effusions, Normal ROM.  11. No Palpable Lymph Nodes in Neck or Axillae    Data Review:    CBC Recent Labs  Lab 03/08/20 1144  WBC 11.8*  HGB 7.8*  HCT 26.8*  PLT 384  MCV 83.8  MCH 24.4*  MCHC 29.1*  RDW 23.6*  LYMPHSABS 1.7  MONOABS 0.9  EOSABS 0.1  BASOSABS 0.1   ------------------------------------------------------------------------------------------------------------------  Chemistries  Recent Labs  Lab 03/08/20 1144  NA 136  K 2.6*  CL 94*  CO2 27  GLUCOSE 109*  BUN 8  CREATININE 0.41*  CALCIUM 7.9*  MG 2.4  AST 61*  ALT 17  ALKPHOS 380*  BILITOT 1.2   ------------------------------------------------------------------------------------------------------------------ estimated creatinine clearance is 60.7 mL/min (A) (by C-G formula based on SCr of 0.41 mg/dL (L)). ------------------------------------------------------------------------------------------------------------------ No results for input(s): TSH, T4TOTAL, T3FREE, THYROIDAB in the last 72 hours.  Invalid input(s): FREET3  Coagulation profile Recent Labs  Lab 03/08/20 1615  INR 1.1    ------------------------------------------------------------------------------------------------------------------- No results for input(s): DDIMER in the last 72 hours. -------------------------------------------------------------------------------------------------------------------  Cardiac Enzymes No results for input(s): CKMB, TROPONINI, MYOGLOBIN in the last 168 hours.  Invalid input(s): CK ------------------------------------------------------------------------------------------------------------------ No results found for: BNP   ---------------------------------------------------------------------------------------------------------------  Urinalysis    Component Value Date/Time   COLORURINE AMBER (A) 03/08/2020 1126   APPEARANCEUR CLOUDY (A) 03/08/2020 1126   LABSPEC 1.024 03/08/2020 1126   PHURINE 5.0 03/08/2020 1126   GLUCOSEU NEGATIVE 03/08/2020 1126   [place] 03/08/2020 1126   BILIRUBINUR SMALL (A) 03/08/2020 1126   Saline 03/08/2020 1126   PROTEINUR 30 (A) 03/08/2020 1126   NITRITE NEGATIVE 03/08/2020 1126  LEUKOCYTESUR MODERATE (A) 03/08/2020 1126    ----------------------------------------------------------------------------------------------------------------   Imaging Results:    CT ABDOMEN PELVIS W CONTRAST  Result Date: 03/08/2020 CLINICAL DATA:  Abdominal pain. Evaluate for bowel obstruction. EXAM: CT ABDOMEN AND PELVIS WITH CONTRAST TECHNIQUE: Multidetector CT imaging of the abdomen and pelvis was performed using the standard protocol following bolus administration of intravenous contrast. CONTRAST:  4mL OMNIPAQUE IOHEXOL 300 MG/ML  SOLN COMPARISON:  None. FINDINGS: Lower chest: Calcification identified within the LAD coronary artery. The heart size appears within normal limits. No pericardial effusion. Small right pleural effusion. Imaged portions of the lungs are otherwise clear. Hepatobiliary: The liver is enlarged. There are  innumerable ill-defined, peripherally enhancing lesions throughout the liver compatible with metastatic disease. Lesions are too numerous to count including: Index segment 4 lesion measures 7.7 x 6.4 cm, image 26/2. Index lesion within segment 8 measures 7.4 x 8.4 cm. Index lesion within segment 2 measures 4.3 by 3.4 cm, image 25/2. Evidence of intrahepatic biliary obstruction is noted with dilatation of the intrahepatic bile ducts and normal common bile duct, image 23/2. Gallbladder negative. Pancreas: Unremarkable. No pancreatic ductal dilatation or surrounding inflammatory changes. Spleen: Normal in size without focal abnormality. Adrenals/Urinary Tract: Normal adrenal glands. No kidney mass or hydronephrosis identified bilaterally. Bladder unremarkable. Stomach/Bowel: Stomach is nondistended. No bowel wall thickening, no small bowel wall thickening, inflammation or distension. Circumferential mass involving the hepatic flexure is identified this, image 48/2 and image 53/6. This involves a approximate 5.8 cm segment of colon, image 48/6. No obstruction. Vascular/Lymphatic: Aortic atherosclerosis without aneurysm. Enlarged ileocolic lymph node measures 2.2 x 2.3 cm. Gastrohepatic ligament lymph node measures 1.3 cm short axis, image 31/2. Prominent but non pathologically enlarged external iliac lymph nodes are identified including 9 mm left external iliac node, image 75/2. Reproductive: The uterus is enlarged and contains 2 high attenuation masses which are favored to represent fibroids. The largest measures 4 cm, image 69/7. No adnexal mass identified. Other: Moderate volume of ascites is noted within the abdomen and pelvis. Although no definite peritoneal nodule or mass identified, peritoneal carcinomatosis cannot be excluded. Diffuse body wall edema is identified compatible with anasarca. Musculoskeletal: No acute or suspicious bone lesions identified. IMPRESSION: 1. Nonobstructing circumferential mass  involving the hepatic flexure is identified concerning for colon cancer. Further investigation with colonoscopy is advised. 2. Innumerable liver metastases are noted. Additionally, there are enlarged ileocolic and gastrohepatic ligament lymph node concerning for metastatic adenopathy. 3. Moderate volume of ascites noted within the abdomen and pelvis. Although no definite peritoneal nodule or mass identified, peritoneal carcinomatosis cannot be excluded. Consider further evaluation with diagnostic paracentesis. 4. Small right pleural effusion. 5. Aortic atherosclerosis. LAD coronary artery calcifications noted. 6. Enlarged uterus containing 2 high attenuation masses which are favored to represent fibroids. Aortic Atherosclerosis (ICD10-I70.0). Electronically Signed   By: Kerby Moors M.D.   On: 03/08/2020 15:14    My personal review of EKG: Rhythm NSR, Rate 94   Assessment & Plan:    Active Problems:   Anemia   Colonic mass   Colon mass with liver mets/abdominal distention -This is most likely related to colonic Izora Gala, with liver metastasis, have discussed with GI, plan for colonoscopy for her, she will be kept n.p.o. at midnight, with bowel prep today. -GI consulted, to see in a.m.Marland Kitchen  Severe hypokalemia -Continue on telemetry monitor, repleted, magnesium within normal limit  Hypophosphatemia -Repleted.  Elevated LFTs/hypoalbuminemia. -Patient reports history of alcohol abuse, as discussed with GI, she does appear to  be having significant hepatomegaly secondary to metastasis, so liver mets most likely contributing factor, as well causing hypoalbuminemia and lower extremity edema. -Not enough fluid for paracentesis.  SIRS -She presents with elevated lactic acid, low blood pressure and leukocytosis and tachycardia, no evidence of infection at this point, she has positive UA, but denies any dysuria or polyuria, will follow-up blood cultures, urine cultures, keep empirically on Rocephin and  Flagyl.  Anemia of chronic blood loss. -Hemoglobin 7.8, will transfuse 1 unit PRBC, per my discussion with GI, they prefer to be hemoglobin more than 8 before colonoscopy procedure and sedation.  History of alcohol abuse -Reports abstinence since last hospitalization March of this year   DVT Prophylaxis  SCDs   AM Labs Ordered, also please review Full Orders  Family Communication: Admission, patients condition and plan of care including tests being ordered have been discussed with the patient and Mother who indicate understanding and agree with the plan and Code Status.  Code Status Full  Likely DC to  Home  Condition GUARDED   Consults called: GI  Admission status:  inpatient  Time spent in minutes : 60 minutes   Phillips Climes M.D on 03/08/2020 at 5:00 PM  Between 7am to 7pm - Pager - [phone]. After 7pm go to [email or web address] - password [facility]  [facility] - Office  [phone]

## 2020-03-08 NOTE — ED Triage Notes (Signed)
Pt reports lower back pain radiating to abdomen. Pt reports bilateral lower extremity and abdominal swelling for last several days.

## 2020-03-08 NOTE — Progress Notes (Signed)
CSW has reviewed patients chart and notes that patient is without primary care and/or insurance. CSW will follow up with patient regarding this matter and provide assistance as needed.   Diala Waxman M. Mayda Shippee LCSWA Transitions of Care  Clinical Social Worker  Ph: [phone] 

## 2020-03-08 NOTE — ED Notes (Signed)
AC called for meds & hospital bed

## 2020-03-08 NOTE — ED Provider Notes (Signed)
[facility] EMERGENCY DEPARTMENT Provider Note   CSN: [id number] Arrival date & time: 03/08/20  [id number]     History Chief Complaint  Patient presents with   Abdominal Pain    Natalie Washington is a 54 y.o. female with a recent history of GI bleed and profound anemia (5.1) for which she was admitted to this hospital and obtained blood transfusions (2 units) after which her dc hgb was 8.2, currently pending endoscopy and colonoscopy by Dr. Gala Romney presenting for several day history of abdominal pain and distention.  She denies nausea or vomiting, she does endorse abdominal discomfort secondary to fullness.  She has pain that radiates from her lower back into her abdomen.  She also endorses possible constipation.  Although she denies any rectal pain, she has not had a bowel movement x1 week.  She denies dysuria or hematuria.  She has also noted swelling in her bilateral lower extremities.  She does have a significant history of EtOH abuse, states currently however that she drinks "every blue moon".   HPI     Past Medical History:  Diagnosis Date   Acid reflux    Pneumonia    1980    Patient Active Problem List   Diagnosis Date Noted   Colonic mass 03/08/2020   Heme positive stool    GI bleed 01/23/2020   Anemia 01/23/2020    Past Surgical History:  Procedure Laterality Date   ORIF HUMERUS FRACTURE Right 04/07/2019   Procedure: OPEN REDUCTION INTERNAL FIXATION (ORIF) PROXIMAL HUMERUS FRACTURE; BICEP TENODESIS;  Surgeon: Hiram Gash, MD;  Location: [facility];  Service: Orthopedics;  Laterality: Right;   WISDOM TOOTH EXTRACTION       OB History   No obstetric history on file.     Family History  Problem Relation Age of Onset   Colon cancer Father        unsure age of onset. Passed away in 2005-02-17    Social History   Tobacco Use   Smoking status: Current Every Day Smoker    Packs/day: 0.25    Years: 15.00    Pack years: 3.75   Smokeless tobacco: Never Used    Substance Use Topics   Alcohol use: Yes    Comment: once a week   Drug use: Never    Home Medications Prior to Admission medications   Medication Sig Start Date End Date Taking? Authorizing Provider  pantoprazole (PROTONIX) 40 MG tablet Take 1 tablet (40 mg total) by mouth 2 (two) times daily. 01/25/20 03/25/20 Yes Shah, Pratik D, DO    Allergies    Patient has no known allergies.  Review of Systems   Review of Systems  Constitutional: Negative for chills and fever.  HENT: Negative for congestion and sore throat.   Eyes: Negative.   Respiratory: Negative for chest tightness and shortness of breath.   Cardiovascular: Positive for leg swelling. Negative for chest pain.  Gastrointestinal: Positive for abdominal distention, abdominal pain and constipation. Negative for nausea and vomiting.  Genitourinary: Negative.   Musculoskeletal: Negative for arthralgias, joint swelling and neck pain.  Skin: Negative.  Negative for rash and wound.  Neurological: Positive for weakness. Negative for dizziness, light-headedness, numbness and headaches.  Psychiatric/Behavioral: Negative.     Physical Exam Updated Vital Signs BP 105/81    Pulse (!) 102    Temp 97.9 F (36.6 C) (Oral)    Resp (!) 30    Ht 5\' 1"  (1.549 m)    Wt 52.6 kg  SpO2 100%    BMI 21.92 kg/m   Physical Exam Vitals and nursing note reviewed.  Constitutional:      Appearance: She is well-developed.  HENT:     Head: Normocephalic and atraumatic.  Eyes:     General: No scleral icterus.    Conjunctiva/sclera: Conjunctivae normal.  Cardiovascular:     Rate and Rhythm: Normal rate and regular rhythm.     Heart sounds: Normal heart sounds.  Pulmonary:     Effort: Pulmonary effort is normal.     Breath sounds: Normal breath sounds. No wheezing.  Abdominal:     General: Bowel sounds are decreased. There is distension.     Palpations: Abdomen is soft.     Tenderness: There is generalized abdominal tenderness. There is no  guarding or rebound.     Comments: Increased tympany to percussion.   Musculoskeletal:        General: Normal range of motion.     Cervical back: Normal range of motion.  Skin:    General: Skin is warm and dry.     Coloration: Skin is not jaundiced.  Neurological:     General: No focal deficit present.     Mental Status: She is alert.     ED Results / Procedures / Treatments   Labs (all labs ordered are listed, but only abnormal results are displayed) Labs Reviewed  CBC WITH DIFFERENTIAL/PLATELET - Abnormal; Notable for the following components:      Result Value   WBC 11.8 (*)    RBC 3.20 (*)    Hemoglobin 7.8 (*)    HCT 26.8 (*)    MCH 24.4 (*)    MCHC 29.1 (*)    RDW 23.6 (*)    Neutro Abs 9.0 (*)    All other components within normal limits  COMPREHENSIVE METABOLIC PANEL - Abnormal; Notable for the following components:   Potassium 2.6 (*)    Chloride 94 (*)    Glucose, Bld 109 (*)    Creatinine, Ser 0.41 (*)    Calcium 7.9 (*)    Total Protein 6.3 (*)    Albumin 2.0 (*)    AST 61 (*)    Alkaline Phosphatase 380 (*)    All other components within normal limits  URINALYSIS, ROUTINE W REFLEX MICROSCOPIC - Abnormal; Notable for the following components:   Color, Urine AMBER (*)    APPearance CLOUDY (*)    Bilirubin Urine SMALL (*)    Protein, ur 30 (*)    Leukocytes,Ua MODERATE (*)    WBC, UA >50 (*)    Bacteria, UA RARE (*)    All other components within normal limits  LACTATE DEHYDROGENASE - Abnormal; Notable for the following components:   LDH 2,164 (*)    All other components within normal limits  LACTIC ACID, PLASMA - Abnormal; Notable for the following components:   Lactic Acid, Venous 4.9 (*)    All other components within normal limits  CULTURE, BLOOD (ROUTINE X 2)  CULTURE, BLOOD (ROUTINE X 2)  SARS CORONAVIRUS 2 BY RT PCR (HOSPITAL ORDER, [facility])  URINE CULTURE  LIPASE, BLOOD  PROTIME-INR  MAGNESIUM  PHOSPHORUS    BASIC METABOLIC PANEL    EKG None  Radiology CT ABDOMEN PELVIS W CONTRAST  Result Date: 03/08/2020 CLINICAL DATA:  Abdominal pain. Evaluate for bowel obstruction. EXAM: CT ABDOMEN AND PELVIS WITH CONTRAST TECHNIQUE: Multidetector CT imaging of the abdomen and pelvis was performed using the standard  protocol following bolus administration of intravenous contrast. CONTRAST:  13mL OMNIPAQUE IOHEXOL 300 MG/ML  SOLN COMPARISON:  None. FINDINGS: Lower chest: Calcification identified within the LAD coronary artery. The heart size appears within normal limits. No pericardial effusion. Small right pleural effusion. Imaged portions of the lungs are otherwise clear. Hepatobiliary: The liver is enlarged. There are innumerable ill-defined, peripherally enhancing lesions throughout the liver compatible with metastatic disease. Lesions are too numerous to count including: Index segment 4 lesion measures 7.7 x 6.4 cm, image 26/2. Index lesion within segment 8 measures 7.4 x 8.4 cm. Index lesion within segment 2 measures 4.3 by 3.4 cm, image 25/2. Evidence of intrahepatic biliary obstruction is noted with dilatation of the intrahepatic bile ducts and normal common bile duct, image 23/2. Gallbladder negative. Pancreas: Unremarkable. No pancreatic ductal dilatation or surrounding inflammatory changes. Spleen: Normal in size without focal abnormality. Adrenals/Urinary Tract: Normal adrenal glands. No kidney mass or hydronephrosis identified bilaterally. Bladder unremarkable. Stomach/Bowel: Stomach is nondistended. No bowel wall thickening, no small bowel wall thickening, inflammation or distension. Circumferential mass involving the hepatic flexure is identified this, image 48/2 and image 53/6. This involves a approximate 5.8 cm segment of colon, image 48/6. No obstruction. Vascular/Lymphatic: Aortic atherosclerosis without aneurysm. Enlarged ileocolic lymph node measures 2.2 x 2.3 cm. Gastrohepatic ligament lymph node  measures 1.3 cm short axis, image 31/2. Prominent but non pathologically enlarged external iliac lymph nodes are identified including 9 mm left external iliac node, image 75/2. Reproductive: The uterus is enlarged and contains 2 high attenuation masses which are favored to represent fibroids. The largest measures 4 cm, image 69/7. No adnexal mass identified. Other: Moderate volume of ascites is noted within the abdomen and pelvis. Although no definite peritoneal nodule or mass identified, peritoneal carcinomatosis cannot be excluded. Diffuse body wall edema is identified compatible with anasarca. Musculoskeletal: No acute or suspicious bone lesions identified. IMPRESSION: 1. Nonobstructing circumferential mass involving the hepatic flexure is identified concerning for colon cancer. Further investigation with colonoscopy is advised. 2. Innumerable liver metastases are noted. Additionally, there are enlarged ileocolic and gastrohepatic ligament lymph node concerning for metastatic adenopathy. 3. Moderate volume of ascites noted within the abdomen and pelvis. Although no definite peritoneal nodule or mass identified, peritoneal carcinomatosis cannot be excluded. Consider further evaluation with diagnostic paracentesis. 4. Small right pleural effusion. 5. Aortic atherosclerosis. LAD coronary artery calcifications noted. 6. Enlarged uterus containing 2 high attenuation masses which are favored to represent fibroids. Aortic Atherosclerosis (ICD10-I70.0). Electronically Signed   By: Kerby Moors M.D.   On: 03/08/2020 15:14    Procedures Procedures (including critical care time)  Medications Ordered in ED Medications  iohexol (OMNIPAQUE) 9 MG/ML oral solution (has no administration in time range)  potassium chloride 10 mEq in 100 mL IVPB (10 mEq Intravenous New Bag/Given 03/08/20 1546)  piperacillin-tazobactam (ZOSYN) IVPB 3.375 g (has no administration in time range)  potassium chloride SA (KLOR-CON) CR tablet  40 mEq (has no administration in time range)  potassium chloride 10 mEq in 100 mL IVPB (has no administration in time range)  magnesium sulfate IVPB 2 g 50 mL (has no administration in time range)  0.9 % NaCl with KCl 20 mEq/ L  infusion (has no administration in time range)  sodium chloride 0.9 % bolus 500 mL (0 mLs Intravenous Stopped 03/08/20 1325)  iohexol (OMNIPAQUE) 300 MG/ML solution 100 mL (75 mLs Intravenous Contrast Given 03/08/20 1414)  sodium chloride 0.9 % bolus 1,000 mL (0 mLs Intravenous Stopped 03/08/20 1543)  ED Course  I have reviewed the triage vital signs and the nursing notes.  Pertinent labs & imaging results that were available during my care of the patient were reviewed by me and considered in my medical decision making (see chart for details).    MDM Rules/Calculators/A&P                      Pt's labs and imaging reviewed and discussed with pt and mother at bedside.  Significant hypokalemia (2.6) along with again downtrending hemoglobin at 7.8.  CT imaging with hepatic flexure circumferential lesion suggesting primary colon CA.  Multiple liver mets with complicating intrahepatic biliary obstruction.  Ascites. Peritoneal carcinomatosis cannot be excluded.  Elevated wbc count, modest abdominal pain, distention and vomiting primary sx.  Her lactic acid is elevated at 4.9 - I suspect this is a non sepsis elevation, but will cover with zosyn given concerning intrahepatic biliary obstruction.    Discussed with Dr. Melony Overly who reviewed the CT imaging.  He states that Dr. Gala Romney will follow up with patient tomorrow morning and may anticipate a diagnostic colonoscopy tomorrow.  In the interim she can remain on a clear liquid diet.  Discussed with Dr. Waldron Labs who accepts pt for admission. Final Clinical Impression(s) / ED Diagnoses Final diagnoses:  Colonic mass  Liver metastases ([place])  Hypokalemia  Anemia, unspecified type    Rx / DC Orders ED Discharge Orders    None         Landis Martins 03/08/20 [place], Wonda [facility], MD 03/10/20 [phone]

## 2020-03-08 NOTE — Progress Notes (Signed)
CSW attempting to contact patient on room phone without success. Will attempt to contact patient in another way.  [facility]  Clinical Social Worker  Ph: [phone]

## 2020-03-08 NOTE — ED Notes (Signed)
Date and time results received: 03/08/20  1428 (use smartphrase ".now" to insert current time)  Test: lactic acid Critical Value: 4.9  Name of Provider Notified: Evalee Jefferson PA  Orders Received? Or Actions Taken?: n/a

## 2020-03-09 ENCOUNTER — Encounter (HOSPITAL_COMMUNITY)
Admission: EM | Disposition: A | Discharge status: Home / Self Care | Payer: Self-pay | Source: Home / Self Care | Attending: Internal Medicine

## 2020-03-09 ENCOUNTER — Inpatient Hospital Stay (HOSPITAL_COMMUNITY): Payer: Medicaid Other | Admitting: Anesthesiology

## 2020-03-09 ENCOUNTER — Encounter: Payer: Self-pay | Admitting: Hematology

## 2020-03-09 ENCOUNTER — Encounter (HOSPITAL_COMMUNITY): Payer: Self-pay | Admitting: Internal Medicine

## 2020-03-09 DIAGNOSIS — C787 Secondary malignant neoplasm of liver and intrahepatic bile duct: Secondary | ICD-10-CM | POA: Insufficient documentation

## 2020-03-09 DIAGNOSIS — R180 Malignant ascites: Secondary | ICD-10-CM

## 2020-03-09 DIAGNOSIS — D490 Neoplasm of unspecified behavior of digestive system: Secondary | ICD-10-CM

## 2020-03-09 DIAGNOSIS — N390 Urinary tract infection, site not specified: Secondary | ICD-10-CM

## 2020-03-09 DIAGNOSIS — K635 Polyp of colon: Secondary | ICD-10-CM

## 2020-03-09 DIAGNOSIS — K921 Melena: Secondary | ICD-10-CM

## 2020-03-09 HISTORY — PX: COLONOSCOPY WITH PROPOFOL: SHX5780

## 2020-03-09 HISTORY — PX: POLYPECTOMY: SHX5525

## 2020-03-09 HISTORY — PX: BIOPSY: SHX5522

## 2020-03-09 LAB — COMPREHENSIVE METABOLIC PANEL
ALT: 15 U/L (ref 0–44)
AST: 54 U/L — ABNORMAL HIGH (ref 15–41)
Albumin: 1.7 g/dL — ABNORMAL LOW (ref 3.5–5.0)
Alkaline Phosphatase: 283 U/L — ABNORMAL HIGH (ref 38–126)
Anion gap: 10 (ref 5–15)
BUN: 7 mg/dL (ref 6–20)
CO2: 24 mmol/L (ref 22–32)
Calcium: 7.1 mg/dL — ABNORMAL LOW (ref 8.9–10.3)
Chloride: 101 mmol/L (ref 98–111)
Creatinine, Ser: 0.32 mg/dL — ABNORMAL LOW (ref 0.44–1.00)
GFR calc Af Amer: >60 mL/min (ref >60)
GFR calc non Af Amer: >60 mL/min (ref >60)
Glucose, Bld: 97 mg/dL (ref 70–99)
Potassium: 3.6 mmol/L (ref 3.5–5.1)
Sodium: 135 mmol/L (ref 135–145)
Total Bilirubin: 1.5 mg/dL — ABNORMAL HIGH (ref 0.3–1.2)
Total Protein: 5 g/dL — ABNORMAL LOW (ref 6.5–8.1)

## 2020-03-09 LAB — CBC
HCT: 25.5 % — ABNORMAL LOW (ref 36.0–46.0)
Hemoglobin: 7.7 g/dL — ABNORMAL LOW (ref 12.0–15.0)
MCH: 25.3 pg — ABNORMAL LOW (ref 26.0–34.0)
MCHC: 30.2 g/dL (ref 30.0–36.0)
MCV: 83.9 fL (ref 80.0–100.0)
Platelets: 339 10*3/uL (ref 150–400)
RBC: 3.04 MIL/uL — ABNORMAL LOW (ref 3.87–5.11)
RDW: 21.1 % — ABNORMAL HIGH (ref 11.5–15.5)
WBC: 12.2 10*3/uL — ABNORMAL HIGH (ref 4.0–10.5)
nRBC: 0.2 % (ref 0.0–0.2)

## 2020-03-09 LAB — COLOGUARD: Cologuard: NEGATIVE

## 2020-03-09 LAB — BASIC METABOLIC PANEL
Anion gap: 10 (ref 5–15)
BUN: 7 mg/dL (ref 6–20)
CO2: 23 mmol/L (ref 22–32)
Calcium: 7 mg/dL — ABNORMAL LOW (ref 8.9–10.3)
Chloride: 100 mmol/L (ref 98–111)
Creatinine, Ser: 0.34 mg/dL — ABNORMAL LOW (ref 0.44–1.00)
GFR calc Af Amer: >60 mL/min (ref >60)
GFR calc non Af Amer: >60 mL/min (ref >60)
Glucose, Bld: 115 mg/dL — ABNORMAL HIGH (ref 70–99)
Potassium: 3.8 mmol/L (ref 3.5–5.1)
Sodium: 133 mmol/L — ABNORMAL LOW (ref 135–145)

## 2020-03-09 LAB — VITAMIN D 25 HYDROXY (VIT D DEFICIENCY, FRACTURES): Vit D, 25-Hydroxy: 7.47 ng/mL — ABNORMAL LOW (ref 30–100)

## 2020-03-09 LAB — LACTIC ACID, PLASMA: Lactic Acid, Venous: 3.4 mmol/L (ref 0.5–1.9)

## 2020-03-09 LAB — MAGNESIUM: Magnesium: 2 mg/dL (ref 1.7–2.4)

## 2020-03-09 SURGERY — COLONOSCOPY WITH PROPOFOL
Anesthesia: General

## 2020-03-09 MED ORDER — SENNOSIDES-DOCUSATE SODIUM 8.6-50 MG PO TABS
2.0000 | ORAL_TABLET | Freq: Every evening | ORAL | Status: DC | PRN
  Filled 2020-03-09: qty 2

## 2020-03-09 MED ORDER — PROPOFOL 500 MG/50ML IV EMUL
INTRAVENOUS | Status: DC | PRN
  Administered 2020-03-09: 150 ug/kg/min via INTRAVENOUS

## 2020-03-09 MED ORDER — LACTATED RINGERS IV SOLN
Freq: Once | INTRAVENOUS | Status: AC
  Administered 2020-03-09: 1000 mL via INTRAVENOUS

## 2020-03-09 MED ORDER — IPRATROPIUM-ALBUTEROL 0.5-2.5 (3) MG/3ML IN SOLN: 3.0000 mL | RESPIRATORY_TRACT | Status: DC | PRN

## 2020-03-09 MED ORDER — SODIUM CHLORIDE 0.9 % IV SOLN: INTRAVENOUS | Status: DC

## 2020-03-09 MED ORDER — PROPOFOL 10 MG/ML IV BOLUS
INTRAVENOUS | Status: DC | PRN
  Administered 2020-03-09: 50 mg via INTRAVENOUS
  Administered 2020-03-09: 30 mg via INTRAVENOUS
  Administered 2020-03-09 (×2): 20 mg via INTRAVENOUS

## 2020-03-09 MED ORDER — OXYCODONE HCL 5 MG PO TABS
5.0000 mg | ORAL_TABLET | ORAL | Status: DC | PRN
  Administered 2020-03-09 – 2020-03-10 (×2): 5 mg via ORAL
  Filled 2020-03-09 (×2): qty 1

## 2020-03-09 MED ORDER — POLYETHYLENE GLYCOL 3350 17 G PO PACK: 17.0000 g | PACK | Freq: Every day | ORAL | Status: DC | PRN

## 2020-03-09 MED ORDER — LACTATED RINGERS IV SOLN: INTRAVENOUS | Status: DC | PRN

## 2020-03-09 MED ORDER — POTASSIUM CHLORIDE 10 MEQ/100ML IV SOLN: 10.0000 meq | INTRAVENOUS | Status: AC

## 2020-03-09 MED ORDER — PROPOFOL 10 MG/ML IV BOLUS
INTRAVENOUS | Status: AC
  Filled 2020-03-09: qty 40

## 2020-03-09 MED ORDER — SODIUM CHLORIDE 0.9 % IV SOLN: INTRAVENOUS | Status: AC

## 2020-03-09 MED ORDER — BISACODYL 5 MG PO TBEC
10.0000 mg | DELAYED_RELEASE_TABLET | Freq: Once | ORAL | Status: DC
  Filled 2020-03-09: qty 2

## 2020-03-09 NOTE — Op Note (Addendum)
[facility] Patient Name: Natalie Washington Procedure Date: 03/09/2020 1:39 PM MRN: [id number] Date of Birth: 06-04-1966 Attending MD: Natalie Washington , MD CSN: [id number] Age: 54 Admit Type: Inpatient Procedure:                Colonoscopy Indications:              Hematochezia, Abnormal CT of the GI tract Providers:                Natalie Richards, MD, Natalie Peak B. Sharon Seller, RN,                            Natalie Washington, Technician Referring MD:              Medicines:                Propofol per Anesthesia Complications:            No immediate complications. Estimated Blood Loss:     Estimated blood loss was minimal. Procedure:                Pre-Anesthesia Assessment:                           - Prior to the procedure, a History and Physical                            was performed, and patient medications and                            allergies were reviewed. The patient's tolerance of                            previous anesthesia was also reviewed. The risks                            and benefits of the procedure and the sedation                            options and risks were discussed with the patient.                            All questions were answered, and informed consent                            was obtained. Prior Anticoagulants: The patient has                            taken no previous anticoagulant or antiplatelet                            agents. ASA Grade Assessment: IV - A patient with                            severe systemic disease that is a constant threat  to life. After reviewing the risks and benefits,                            the patient was deemed in satisfactory condition to                            undergo the procedure.                           After obtaining informed consent, the colonoscope                            was passed under direct vision. Throughout the                            procedure,  the patient's blood pressure, pulse, and                            oxygen saturations were monitored continuously. The                            [id number] scope was introduced through                            the anus and advanced to the the cecum, identified                            by appendiceal orifice and ileocecal valve. Scope In: 1:51:19 PM Scope Out: 2:14:39 PM Scope Withdrawal Time: 0 hours 7 minutes 21 seconds  Total Procedure Duration: 0 hours 23 minutes 20 seconds  Findings:      The perianal and digital rectal examinations were normal.      Scattered medium-mouthed diverticula were found in the sigmoid colon and       descending colon.      A 5 mm polyp was found in the splenic flexure. The polyp was       pedunculated. The polyp was removed with a cold snare. Resection and       retrieval were complete. Estimated blood loss was minimal.      Nearly obstructing necrotic, exophytic mass in the vicinity of the       hepatic flexure/distal ascending colon. Minimal remaining lumen       laterally. Scope advanced across this to the cecum. Mass nearly       circumferential. Approximately 6 to 7 cm in length. This lesion tended       to bleed vigorously with minimal manipulation.      Lesion was cold biopsied.      The exam was otherwise without abnormality on direct and retroflexion       views. Impression:               - Diverticulosis in the sigmoid colon and in the                            descending colon.                           -  One 5 mm polyp at the splenic flexure, removed                            with a cold snare. Resected and retrieved.                           -Nearly obstructing exophytic, necrotic appearing                            mass at hepatic flexure/distal ascending segment.                            Status post biopsy. Moderate Sedation:      Moderate (conscious) sedation was personally administered by an       anesthesia  professional. The following parameters were monitored: oxygen       saturation, heart rate, blood pressure, respiratory rate, EKG, adequacy       of pulmonary ventilation, and response to care. Recommendation:           - Return patient to hospital ward for ongoing care.                            Would follow H&H fairly closely. Allow clear liquid                            diet. Surgery and oncology consultation. Follow-up                            on pathology. I discussed my findings and                            recommendations with Natalie Washington, patient's                            sister, at [phone] Procedure Code(s):        --- Professional ---                           [phone], Colonoscopy, flexible; with removal of                            tumor(s), polyp(s), or other lesion(s) by snare                            technique Diagnosis Code(s):        --- Professional ---                           K63.5, Polyp of colon                           K92.1, Melena (includes Hematochezia)                           K57.30, Diverticulosis of large intestine without  perforation or abscess without bleeding                           R93.3, Abnormal findings on diagnostic imaging of                            other parts of digestive tract CPT copyright 2019 [employer]. All rights reserved. The codes documented in this report are preliminary and upon coder review may  be revised to meet current compliance requirements. Natalie Washington. Natalie Troup, MD Natalie Richards, MD 03/09/2020 2:35:30 PM This report has been signed electronically. Number of Addenda: 0

## 2020-03-09 NOTE — Transfer of Care (Signed)
Immediate Anesthesia Transfer of Care Note  Patient: Environmental education officer  Procedure(s) Performed: COLONOSCOPY WITH PROPOFOL (N/A ) POLYPECTOMY BIOPSY  Patient Location: PACU  Anesthesia Type:General  Level of Consciousness: awake  Airway & Oxygen Therapy: Patient Spontanous Breathing  Post-op Assessment: Report given to RN  Post vital signs: Reviewed  Last Vitals:  Vitals Value Taken Time  BP    Temp    Pulse 54 03/09/20 1424  Resp    SpO2 85 % 03/09/20 1424  Vitals shown include unvalidated device data.  Last Pain:  Vitals:   03/09/20 1349  TempSrc:   PainSc: 0-No pain      Patients Stated Pain Goal: 8 ([id number])  Complications: No apparent anesthesia complications

## 2020-03-09 NOTE — Progress Notes (Signed)
PROGRESS NOTE    Natalie Washington  OX:[id number] DOB: [birth year] DOA: 03/08/2020 PCP: Patient, No Pcp Per   Brief Narrative:  54 year old with history of alcohol use, tobacco use recent hospitalization for profound anemia secondary to GI bleed when she refused endoscopic and colonoscopy evaluation.  After going home started having rectal bleeding therefore came back to the hospital.  CT abdomen pelvis showed nonobstructive colonic mass with metastatic disease to liver.  GI was consulted.   Assessment & Plan:   Active Problems:   Anemia   Colonic mass   Nonobstructing colon mass with liver mets/abdominal distention Moderate volume ascites Very concerning for malignancy.  Patient will require colonoscopy evaluation.  Defer the timing of this to gastroenterology.  Supportive care. We will order CEA levels  Severe hypokalemia hypocalcemia Replete aggressively. Check ionized calcium Check vitamin D.  Hypophosphatemia -Repleted.  Elevated LFTs/hypoalbuminemia. Suspect from metastatic disease.  We will continue to monitor this for now.  Urinary tract infection, POA Follow-up urine culture data.  Currently on empiric IV Rocephin.  I will discontinue Flagyl IV fluids.  Anemia of chronic blood loss. Hemoglobin is 7.7.  Status post 1 unit PRBC transfusion.Marland Kitchen  History of alcohol abuse Reports abstinence since last hospitalization March of this year     DVT prophylaxis: scds Code Status: Full  Family Communication:  None at bedside    Status is: Inpatient  Remains inpatient appropriate because:Inpatient level of care appropriate due to severity of illness   Dispo: The patient is from: Home              Anticipated d/c is to: Home              Anticipated d/c date is: 2 days              Patient currently is not medically stable to d/c.  Maintain hospital stay for colonoscopy evaluation for obstructive mass.    Subjective: Patient is okay right now does not  have any complaints at the moment.  Her stools are still mucus.  Review of Systems Otherwise negative except as per HPI, including: General: Denies fever, chills, night sweats or unintended weight loss. Resp: Denies cough, wheezing, shortness of breath. Cardiac: Denies chest pain, palpitations, orthopnea, paroxysmal nocturnal dyspnea. GI: Denies abdominal pain, nausea, vomiting, diarrhea or constipation GU: Denies dysuria, frequency, hesitancy or incontinence MS: Denies muscle aches, joint pain or swelling Neuro: Denies headache, neurologic deficits (focal weakness, numbness, tingling), abnormal gait Psych: Denies anxiety, depression, SI/HI/AVH Skin: Denies new rashes or lesions ID: Denies sick contacts, exotic exposures, travel  Examination:  General exam: Appears chronically ill and frail. Respiratory system: Clear to auscultation. Respiratory effort normal. Cardiovascular system: S1 & S2 heard, RRR. No JVD, murmurs, rubs, gallops or clicks. No pedal edema. Gastrointestinal system: Abdomen is mildly distended.  Nontender.  Some fluid wave shift noted. Central nervous system: Alert and oriented. No focal neurological deficits. Extremities: Symmetric 5 x 5 power. Skin: No rashes, lesions or ulcers Psychiatry: Judgement and insight appear normal. Mood & affect appropriate.     Objective: Vitals:   03/09/20 0700 03/09/20 0800 03/09/20 0900 03/09/20 1000  BP: 95/70 103/79 108/83 104/80  Pulse: 89 89 92 90  Resp: 19 19 20 20   Temp:      TempSrc:      SpO2: 99% 100% 100% 100%  Weight:      Height:        Intake/Output Summary (Last 24 hours) at 03/09/2020 1141 Last data  filed at 03/08/2020 2227 Gross per 24 hour  Intake 2015 ml  Output --  Net 2015 ml   Filed Weights   03/08/20 1109  Weight: 52.6 kg     Data Reviewed:   CBC: Recent Labs  Lab 03/08/20 1144 03/09/20 0348  WBC 11.8* 12.2*  NEUTROABS 9.0*  --   HGB 7.8* 7.7*  HCT 26.8* 25.5*  MCV 83.8 83.9  PLT  384 [id number]   Basic Metabolic Panel: Recent Labs  Lab 03/08/20 1144 03/09/20 0010 03/09/20 0348  NA 136 133* 135  K 2.6* 3.8 3.6  CL 94* 100 101  CO2 27 23 24   GLUCOSE 109* 115* 97  BUN 8 7 7   CREATININE 0.41* 0.34* 0.32*  CALCIUM 7.9* 7.0* 7.1*  MG 2.4  --  2.0  PHOS 2.1*  --   --    GFR: Estimated Creatinine Clearance: 60.7 mL/min (A) (by C-G formula based on SCr of 0.32 mg/dL (L)). Liver Function Tests: Recent Labs  Lab 03/08/20 1144 03/09/20 0348  AST 61* 54*  ALT 17 15  ALKPHOS 380* 283*  BILITOT 1.2 1.5*  PROT 6.3* 5.0*  ALBUMIN 2.0* 1.7*   Recent Labs  Lab 03/08/20 1144  LIPASE 16   No results for input(s): AMMONIA in the last 168 hours. Coagulation Profile: Recent Labs  Lab 03/08/20 1615  INR 1.1   Cardiac Enzymes: No results for input(s): CKTOTAL, CKMB, CKMBINDEX, TROPONINI in the last 168 hours. BNP (last 3 results) No results for input(s): PROBNP in the last 8760 hours. HbA1C: No results for input(s): HGBA1C in the last 72 hours. CBG: No results for input(s): GLUCAP in the last 168 hours. Lipid Profile: No results for input(s): CHOL, HDL, LDLCALC, TRIG, CHOLHDL, LDLDIRECT in the last 72 hours. Thyroid Function Tests: No results for input(s): TSH, T4TOTAL, FREET4, T3FREE, THYROIDAB in the last 72 hours. Anemia Panel: No results for input(s): VITAMINB12, FOLATE, FERRITIN, TIBC, IRON, RETICCTPCT in the last 72 hours. Sepsis Labs: Recent Labs  Lab 03/08/20 1144 03/09/20 0010  LATICACIDVEN 4.9* 3.4*    Recent Results (from the past 240 hour(s))  Blood culture (routine x 2)     Status: None (Preliminary result)   Collection Time: 03/08/20  2:44 PM   Specimen: Right Antecubital; Blood  Result Value Ref Range Status   Specimen Description RIGHT ANTECUBITAL  Final   Special Requests   Final    BOTTLES DRAWN AEROBIC AND ANAEROBIC Blood Culture adequate volume Performed at [facility], [address], [place], [place] [address]    Culture  PENDING  Incomplete   Report Status PENDING  Incomplete  Blood culture (routine x 2)     Status: None (Preliminary result)   Collection Time: 03/08/20  2:44 PM   Specimen: BLOOD RIGHT HAND  Result Value Ref Range Status   Specimen Description BLOOD RIGHT HAND  Final   Special Requests   Final    BOTTLES DRAWN AEROBIC AND ANAEROBIC Blood Culture adequate volume Performed at [facility], [address], [place], [place] [address]    Culture PENDING  Incomplete   Report Status PENDING  Incomplete  SARS Coronavirus 2 by RT PCR (hospital order, performed in [facility] hospital lab) Nasopharyngeal Nasopharyngeal Swab     Status: None   Collection Time: 03/08/20  3:30 PM   Specimen: Nasopharyngeal Swab  Result Value Ref Range Status   SARS Coronavirus 2 NEGATIVE NEGATIVE Final    Comment: (NOTE) SARS-CoV-2 target nucleic acids are NOT DETECTED. The  SARS-CoV-2 RNA is generally detectable in upper and lower respiratory specimens during the acute phase of infection. The lowest concentration of SARS-CoV-2 viral copies this assay can detect is 250 copies / mL. A negative result does not preclude SARS-CoV-2 infection and should not be used as the sole basis for treatment or other patient management decisions.  A negative result may occur with improper specimen collection / handling, submission of specimen other than nasopharyngeal swab, presence of viral mutation(s) within the areas targeted by this assay, and inadequate number of viral copies (<250 copies / mL). A negative result must be combined with clinical observations, patient history, and epidemiological information. Fact Sheet for Patients:   StrictlyIdeas.no Fact Sheet for Healthcare Providers: BankingDealers.co.za This test is not yet approved or cleared  by the [place] FDA and has been authorized for detection and/or diagnosis of SARS-CoV-2 by FDA under an Emergency Use  Authorization (EUA).  This EUA will remain in effect (meaning this test can be used) for the duration of the COVID-19 declaration under Section 564(b)(1) of the Act, 21 U.S.C. section 360bbb-3(b)(1), unless the authorization is terminated or revoked sooner. Performed at [facility], [address], [place], [place] [address]          Radiology Studies: CT ABDOMEN PELVIS W CONTRAST  Result Date: 03/08/2020 CLINICAL DATA:  Abdominal pain. Evaluate for bowel obstruction. EXAM: CT ABDOMEN AND PELVIS WITH CONTRAST TECHNIQUE: Multidetector CT imaging of the abdomen and pelvis was performed using the standard protocol following bolus administration of intravenous contrast. CONTRAST:  17mL OMNIPAQUE IOHEXOL 300 MG/ML  SOLN COMPARISON:  None. FINDINGS: Lower chest: Calcification identified within the LAD coronary artery. The heart size appears within normal limits. No pericardial effusion. Small right pleural effusion. Imaged portions of the lungs are otherwise clear. Hepatobiliary: The liver is enlarged. There are innumerable ill-defined, peripherally enhancing lesions throughout the liver compatible with metastatic disease. Lesions are too numerous to count including: Index segment 4 lesion measures 7.7 x 6.4 cm, image 26/2. Index lesion within segment 8 measures 7.4 x 8.4 cm. Index lesion within segment 2 measures 4.3 by 3.4 cm, image 25/2. Evidence of intrahepatic biliary obstruction is noted with dilatation of the intrahepatic bile ducts and normal common bile duct, image 23/2. Gallbladder negative. Pancreas: Unremarkable. No pancreatic ductal dilatation or surrounding inflammatory changes. Spleen: Normal in size without focal abnormality. Adrenals/Urinary Tract: Normal adrenal glands. No kidney mass or hydronephrosis identified bilaterally. Bladder unremarkable. Stomach/Bowel: Stomach is nondistended. No bowel wall thickening, no small bowel wall thickening, inflammation or distension. Circumferential  mass involving the hepatic flexure is identified this, image 48/2 and image 53/6. This involves a approximate 5.8 cm segment of colon, image 48/6. No obstruction. Vascular/Lymphatic: Aortic atherosclerosis without aneurysm. Enlarged ileocolic lymph node measures 2.2 x 2.3 cm. Gastrohepatic ligament lymph node measures 1.3 cm short axis, image 31/2. Prominent but non pathologically enlarged external iliac lymph nodes are identified including 9 mm left external iliac node, image 75/2. Reproductive: The uterus is enlarged and contains 2 high attenuation masses which are favored to represent fibroids. The largest measures 4 cm, image 69/7. No adnexal mass identified. Other: Moderate volume of ascites is noted within the abdomen and pelvis. Although no definite peritoneal nodule or mass identified, peritoneal carcinomatosis cannot be excluded. Diffuse body wall edema is identified compatible with anasarca. Musculoskeletal: No acute or suspicious bone lesions identified. IMPRESSION: 1. Nonobstructing circumferential mass involving the hepatic flexure is identified concerning for colon cancer. Further investigation with colonoscopy is advised.  2. Innumerable liver metastases are noted. Additionally, there are enlarged ileocolic and gastrohepatic ligament lymph node concerning for metastatic adenopathy. 3. Moderate volume of ascites noted within the abdomen and pelvis. Although no definite peritoneal nodule or mass identified, peritoneal carcinomatosis cannot be excluded. Consider further evaluation with diagnostic paracentesis. 4. Small right pleural effusion. 5. Aortic atherosclerosis. LAD coronary artery calcifications noted. 6. Enlarged uterus containing 2 high attenuation masses which are favored to represent fibroids. Aortic Atherosclerosis (ICD10-I70.0). Electronically Signed   By: Kerby Moors M.D.   On: 03/08/2020 15:14        Scheduled Meds: . bisacodyl  10 mg Oral Once  . pantoprazole  40 mg Oral BID    . phosphorus  500 mg Oral BID   Continuous Infusions: . sodium chloride    . cefTRIAXone (ROCEPHIN)  IV Stopped (03/08/20 1932)  . metronidazole Stopped (03/09/20 [id number])  . potassium chloride       LOS: 1 day   Time spent= 35 mins    Jyrah Blye Arsenio Loader, MD [facility]  If 7PM-7AM, please contact night-coverage  03/09/2020, 11:41 AM

## 2020-03-09 NOTE — Consult Note (Signed)
Referring Provider: Damita Lack, MD Primary Care Physician:  Patient, No Pcp Per Primary Gastroenterologist:  Garfield Cornea, MD  Reason for Consultation:  Colon mass  HPI: Natalie Washington is a 54 y.o. female with history of transfusion dependent profound symptomatic anemia initially presented back in March with a hemoglobin of 5.1, heme positive stools.  She was seen during that admission and offered an upper endoscopy and colonoscopy but she declined and preferred outpatient work-up.  Currently on the schedule for May 18.  Family history of colon cancer in her father, unclear what age. She presented to the hospital yesterday with complaints of abdominal fullness, distention, pain as well as lower extremity edema.  She had a CT abdomen pelvis with contrast showed enlarged liver with innumerable mets, evidence of intrahepatic biliary obstruction with dilation, normal common bile duct.  Circumferential mass involving the hepatic flexure involving approximately 5.8 cm segment of colon but no obstruction.  Moderate volume of ascites.  No definite peritoneal nodule or mass but peritoneal carcinomatosis cannot be excluded.  In the ED her white blood cell count was 11,800, hemoglobin 7.8, potassium 2.6, AST 61, alk phos 380, total bilirubin 1.2, ALT 17, LDH 2164, lactic acid 4.9, magnesium 2.4, Covid negative, INR 1.1.  Today her potassium is 3.8.  Lactic acid down to 3.4.  Hemoglobin 7.7.  White blood cell count 12,200.  AST 54, alk phos 283, total bilirubin 1.5.  Albumin 1.7.  She has been on Rocephin, IV Flagyl.  She received 1 unit of packed red blood cells.  Bowel prep ordered last night, she has consumed about 4 glasses so far.  Stool is brown, liquid.  She denies melena or rectal bleeding.  Abdominal pain improved this morning.  No nausea or vomiting.  Denies aspirin powder use.  She takes Aleve as needed but not daily.  Prior history of alcohol abuse in the remote past per her report.   Complains of lower extremity edema.   Prior to Admission medications   Medication Sig Start Date End Date Taking? Authorizing Provider  pantoprazole (PROTONIX) 40 MG tablet Take 1 tablet (40 mg total) by mouth 2 (two) times daily. 01/25/20 03/25/20 Yes Manuella Ghazi, Pratik D, DO    Current Facility-Administered Medications  Medication Dose Route Frequency Provider Last Rate Last Admin  . acetaminophen (TYLENOL) tablet 650 mg  650 mg Oral Q6H PRN Elgergawy, Silver Huguenin, MD   650 mg at 03/08/20 2307   Or  . acetaminophen (TYLENOL) suppository 650 mg  650 mg Rectal Q6H PRN Elgergawy, Silver Huguenin, MD      . bisacodyl (DULCOLAX) suppository 10 mg  10 mg Rectal Daily PRN Elgergawy, Silver Huguenin, MD      . cefTRIAXone (ROCEPHIN) 2 g in sodium chloride 0.9 % 100 mL IVPB  2 g Intravenous Q24H Elgergawy, Silver Huguenin, MD   Stopped at 03/08/20 1932  . metroNIDAZOLE (FLAGYL) IVPB 500 mg  500 mg Intravenous Q8H Elgergawy, Silver Huguenin, MD   Stopped at 03/09/20 [phone]  . ondansetron (ZOFRAN) tablet 4 mg  4 mg Oral Q6H PRN Elgergawy, Silver Huguenin, MD       Or  . ondansetron (ZOFRAN) injection 4 mg  4 mg Intravenous Q6H PRN Elgergawy, Silver Huguenin, MD      . pantoprazole (PROTONIX) EC tablet 40 mg  40 mg Oral BID Elgergawy, Silver Huguenin, MD   40 mg at 03/08/20 2114  . phosphorus (K PHOS NEUTRAL) tablet 500 mg  500 mg Oral BID Elgergawy, Silver Huguenin,  MD   500 mg at 03/08/20 06-Jan-2306   Current Outpatient Medications  Medication Sig Dispense Refill  . pantoprazole (PROTONIX) 40 MG tablet Take 1 tablet (40 mg total) by mouth 2 (two) times daily. 120 tablet 0    Allergies as of 03/08/2020  . (No Known Allergies)    Past Medical History:  Diagnosis Date  . Acid reflux   . Pneumonia    1980    Past Surgical History:  Procedure Laterality Date  . ORIF HUMERUS FRACTURE Right 04/07/2019   Procedure: OPEN REDUCTION INTERNAL FIXATION (ORIF) PROXIMAL HUMERUS FRACTURE; BICEP TENODESIS;  Surgeon: Hiram Gash, MD;  Location: [facility];  Service: Orthopedics;   Laterality: Right;  . WISDOM TOOTH EXTRACTION      Family History  Problem Relation Age of Onset  . Colon cancer Father        unsure age of onset. Passed away in January 06, 2005    Social History   Socioeconomic History  . Marital status: Single    Spouse name: Not on file  . Number of children: Not on file  . Years of education: Not on file  . Highest education level: Not on file  Occupational History  . Not on file  Tobacco Use  . Smoking status: Current Every Day Smoker    Packs/day: 0.25    Years: 15.00    Pack years: 3.75  . Smokeless tobacco: Never Used  Substance and Sexual Activity  . Alcohol use: Yes    Comment: once a week  . Drug use: Never  . Sexual activity: Not on file  Other Topics Concern  . Not on file  Social History Narrative  . Not on file   Social Determinants of Health   Financial Resource Strain:   . Difficulty of Paying Living Expenses:   Food Insecurity:   . Worried About Charity fundraiser in the Last Year:   . Arboriculturist in the Last Year:   Transportation Needs:   . Film/video editor (Medical):   Marland Kitchen Lack of Transportation (Non-Medical):   Physical Activity:   . Days of Exercise per Week:   . Minutes of Exercise per Session:   Stress:   . Feeling of Stress :   Social Connections:   . Frequency of Communication with Friends and Family:   . Frequency of Social Gatherings with Friends and Family:   . Attends Religious Services:   . Active Member of Clubs or Organizations:   . Attends Archivist Meetings:   Marland Kitchen Marital Status:   Intimate Partner Violence:   . Fear of Current or Ex-Partner:   . Emotionally Abused:   Marland Kitchen Physically Abused:   . Sexually Abused:      ROS:  General: Negative for anorexia, weight loss, fever, chills, fatigue, positive weakness. Eyes: Negative for vision changes.  ENT: Negative for hoarseness, difficulty swallowing , nasal congestion. CV: Negative for chest pain, angina, palpitations, dyspnea  on exertion, positive peripheral edema.  Respiratory: Negative for dyspnea at rest, dyspnea on exertion, cough, sputum, wheezing.  GI: See history of present illness. GU:  Negative for dysuria, hematuria, urinary incontinence, urinary frequency, nocturnal urination.  MS: Negative for joint pain, low back pain.  Derm: Negative for rash or itching.  Neuro: Negative for weakness, abnormal sensation, seizure, frequent headaches, memory loss, confusion.  Psych: Negative for anxiety, depression, suicidal ideation, hallucinations.  Endo: Negative for unusual weight change.  Heme: Negative for bruising or bleeding. Allergy:  Negative for rash or hives.       Physical Examination: Vital signs in last 24 hours: Temp:  [97.9 F (36.6 C)-98.3 F (36.8 C)] 98.2 F (36.8 C) (05/12 2227) Pulse Rate:  [90-122] 90 (05/13 0330) Resp:  [18-35] 23 (05/13 0330) BP: (96-115)/(64-89) 102/86 (05/13 0330) SpO2:  [92 %-100 %] 98 % (05/13 0330) Weight:  [52.6 kg] 52.6 kg (05/12 1109)    General: Well-nourished, well-developed in no acute distress.  Sitting up in bed, talking on the phone.  In good spirits. Head: Normocephalic, atraumatic.   Eyes: Conjunctiva pink, no icterus. Mouth: Oropharyngeal mucosa moist and pink , no lesions erythema or exudate. Neck: Supple without thyromegaly, masses, or lymphadenopathy.  Lungs: Clear to auscultation bilaterally.  Heart: Regular rate and rhythm, no murmurs rubs or gallops.  Abdomen: Bowel sounds are normal, abdomen is full with tenderness in the right upper quadrant.  no abdominal bruits or    hernia , no rebound or guarding.   Rectal: Deferred Extremities: 2+ pitting edema to the knees bilaterally.  No clubbing, deformity.  Neuro: Alert and oriented x 4 , grossly normal neurologically.  Skin: Warm and dry, no rash or jaundice.   Psych: Alert and cooperative, normal mood and affect.        Intake/Output from previous day: 05/12 0701 - 05/13 0700 In: 2015  [Blood:315; IV Piggyback:1700] Out: -  Intake/Output this shift: No intake/output data recorded.  Lab Results: CBC Recent Labs    03/08/20 1144 03/09/20 0348  WBC 11.8* 12.2*  HGB 7.8* 7.7*  HCT 26.8* 25.5*  MCV 83.8 83.9  PLT 384 339   BMET Recent Labs    03/08/20 1144 03/09/20 0010 03/09/20 0348  NA 136 133* 135  K 2.6* 3.8 3.6  CL 94* 100 101  CO2 '27 23 24  ' GLUCOSE 109* 115* 97  BUN '8 7 7  ' CREATININE 0.41* 0.34* 0.32*  CALCIUM 7.9* 7.0* 7.1*   LFT Recent Labs    03/08/20 1144 03/09/20 0348  BILITOT 1.2 1.5*  ALKPHOS 380* 283*  AST 61* 54*  ALT 17 15  PROT 6.3* 5.0*  ALBUMIN 2.0* 1.7*    Lipase Recent Labs    03/08/20 1144  LIPASE 16    PT/INR Recent Labs    03/08/20 1615  LABPROT 14.1  INR 1.1     Lab Results  Component Value Date   IRON 13 (L) 02/11/2020   TIBC 170 (L) 02/11/2020   FERRITIN 74 02/11/2020   No results found for: VITAMINB12 No results found for: FOLATE   Imaging Studies: CT ABDOMEN PELVIS W CONTRAST  Result Date: 03/08/2020 CLINICAL DATA:  Abdominal pain. Evaluate for bowel obstruction. EXAM: CT ABDOMEN AND PELVIS WITH CONTRAST TECHNIQUE: Multidetector CT imaging of the abdomen and pelvis was performed using the standard protocol following bolus administration of intravenous contrast. CONTRAST:  79m OMNIPAQUE IOHEXOL 300 MG/ML  SOLN COMPARISON:  None. FINDINGS: Lower chest: Calcification identified within the LAD coronary artery. The heart size appears within normal limits. No pericardial effusion. Small right pleural effusion. Imaged portions of the lungs are otherwise clear. Hepatobiliary: The liver is enlarged. There are innumerable ill-defined, peripherally enhancing lesions throughout the liver compatible with metastatic disease. Lesions are too numerous to count including: Index segment 4 lesion measures 7.7 x 6.4 cm, image 26/2. Index lesion within segment 8 measures 7.4 x 8.4 cm. Index lesion within segment 2  measures 4.3 by 3.4 cm, image 25/2. Evidence of intrahepatic biliary obstruction is noted  with dilatation of the intrahepatic bile ducts and normal common bile duct, image 23/2. Gallbladder negative. Pancreas: Unremarkable. No pancreatic ductal dilatation or surrounding inflammatory changes. Spleen: Normal in size without focal abnormality. Adrenals/Urinary Tract: Normal adrenal glands. No kidney mass or hydronephrosis identified bilaterally. Bladder unremarkable. Stomach/Bowel: Stomach is nondistended. No bowel wall thickening, no small bowel wall thickening, inflammation or distension. Circumferential mass involving the hepatic flexure is identified this, image 48/2 and image 53/6. This involves a approximate 5.8 cm segment of colon, image 48/6. No obstruction. Vascular/Lymphatic: Aortic atherosclerosis without aneurysm. Enlarged ileocolic lymph node measures 2.2 x 2.3 cm. Gastrohepatic ligament lymph node measures 1.3 cm short axis, image 31/2. Prominent but non pathologically enlarged external iliac lymph nodes are identified including 9 mm left external iliac node, image 75/2. Reproductive: The uterus is enlarged and contains 2 high attenuation masses which are favored to represent fibroids. The largest measures 4 cm, image 69/7. No adnexal mass identified. Other: Moderate volume of ascites is noted within the abdomen and pelvis. Although no definite peritoneal nodule or mass identified, peritoneal carcinomatosis cannot be excluded. Diffuse body wall edema is identified compatible with anasarca. Musculoskeletal: No acute or suspicious bone lesions identified. IMPRESSION: 1. Nonobstructing circumferential mass involving the hepatic flexure is identified concerning for colon cancer. Further investigation with colonoscopy is advised. 2. Innumerable liver metastases are noted. Additionally, there are enlarged ileocolic and gastrohepatic ligament lymph node concerning for metastatic adenopathy. 3. Moderate volume of  ascites noted within the abdomen and pelvis. Although no definite peritoneal nodule or mass identified, peritoneal carcinomatosis cannot be excluded. Consider further evaluation with diagnostic paracentesis. 4. Small right pleural effusion. 5. Aortic atherosclerosis. LAD coronary artery calcifications noted. 6. Enlarged uterus containing 2 high attenuation masses which are favored to represent fibroids. Aortic Atherosclerosis (ICD10-I70.0). Electronically Signed   By: Kerby Moors M.D.   On: 03/08/2020 15:14  [4 week]   Impression: Pleasant 54 year old female with transfusion dependent anemia, heme positive stool, declined endoscopic evaluation during admission back in March and currently on the scheduled for EGD and colonoscopy next week.  Presented to the emergency department with abdominal distention, discomfort, lower extremity edema.  Hemoglobin on admission, 7.8 and after 1 unit of blood, hemoglobin 7.7.  CT with circumferential mass involving the hepatic flexure and innumerable liver metastasis, moderate volume ascites, enlarged ileocolic and gastrohepatic ligament lymph nodes concerning for metastatic colon cancer.  Abnormal LFTs likely related to liver metastasis with evidence of intrahepatic biliary obstruction.  Lower extremity edema in the setting of hypoalbuminemia.  Plan: 1. Colonoscopy with deep sedation today.  Patient in the process of bowel prep.  Explained process and need to continue to complete bowel prep so that we can pursue colonoscopy today.  I have discussed the risks, alternatives, benefits with regards to but not limited to the risk of reaction to medication, bleeding, infection, perforation and the patient is agreeable to proceed. Written consent to be obtained.  We would like to thank you for the opportunity to participate in the care of Natalie Washington.  Laureen Ochs. Bernarda Caffey [facility] [phone] 5/13/20219:27 AM     LOS: 1 day

## 2020-03-09 NOTE — Progress Notes (Signed)
Per Advertising account executive, patient to go to room 334, nurse will be Alethia and has stepped off the floor, to return call back for report.

## 2020-03-09 NOTE — Anesthesia Postprocedure Evaluation (Signed)
Anesthesia Post Note  Patient: Hospital doctor) Performed: COLONOSCOPY WITH PROPOFOL (N/A ) POLYPECTOMY BIOPSY  Patient location during evaluation: PACU Anesthesia Type: General Level of consciousness: awake and alert and oriented Pain management: pain level controlled Vital Signs Assessment: post-procedure vital signs reviewed and stable Respiratory status: spontaneous breathing Cardiovascular status: blood pressure returned to baseline Postop Assessment: no apparent nausea or vomiting Anesthetic complications: no     Last Vitals:  Vitals:   03/09/20 1130 03/09/20 1244  BP: (!) 146/82 112/87  Pulse: 96 99  Resp: (!) 29 (!) 24  Temp:  36.4 C  SpO2: 99% 100%    Last Pain:  Vitals:   03/09/20 1349  TempSrc:   PainSc: 0-No pain                 Anamae Rochelle

## 2020-03-09 NOTE — Progress Notes (Signed)
Waiting on return call from [facility] charge nurse to verify bed placement.

## 2020-03-09 NOTE — Progress Notes (Signed)
PACU on hold, waiting on inpatient bed to be approved. Will continue to monitor.

## 2020-03-09 NOTE — Anesthesia Preprocedure Evaluation (Signed)
Anesthesia Evaluation  Patient identified by MRN, date of birth, ID band Patient awake    Reviewed: Allergy & Precautions, NPO status , Patient's Chart, lab work & pertinent test results  Airway Mallampati: II  TM Distance: >3 FB Neck ROM: Full    Dental  (+) Missing, Dental Advisory Given   Pulmonary pneumonia, Current Smoker and Patient abstained from smoking.,    Pulmonary exam normal breath sounds clear to auscultation       Cardiovascular Exercise Tolerance: Good Normal cardiovascular exam Rhythm:Regular Rate:Normal     Neuro/Psych negative neurological ROS  negative psych ROS   GI/Hepatic Neg liver ROS, GERD  Medicated and Controlled,  Endo/Other  negative endocrine ROS  Renal/GU negative Renal ROS  negative genitourinary   Musculoskeletal   Abdominal   Peds  Hematology negative hematology ROS (+) anemia ,   Anesthesia Other Findings   Reproductive/Obstetrics                             Anesthesia Physical Anesthesia Plan  ASA: II  Anesthesia Plan: General   Post-op Pain Management:    Induction: Intravenous  PONV Risk Score and Plan:   Airway Management Planned: Natural Airway, Nasal Cannula and Simple Face Mask  Additional Equipment:   Intra-op Plan:   Post-operative Plan:   Informed Consent: I have reviewed the patients History and Physical, chart, labs and discussed the procedure including the risks, benefits and alternatives for the proposed anesthesia with the patient or authorized representative who has indicated his/her understanding and acceptance.     Dental advisory given  Plan Discussed with: CRNA and Surgeon  Anesthesia Plan Comments:         Anesthesia Quick Evaluation

## 2020-03-09 NOTE — ED Notes (Signed)
Patient assisted with cleansing drink per GI MD.

## 2020-03-09 NOTE — Plan of Care (Signed)
Patient will benefit from ongoing skilled PT services in to continue to advance safe functional mobility, address ongoing impairments in and minimize fall risk. 

## 2020-03-10 ENCOUNTER — Inpatient Hospital Stay (HOSPITAL_COMMUNITY): Payer: Medicaid Other

## 2020-03-10 DIAGNOSIS — E876 Hypokalemia: Secondary | ICD-10-CM

## 2020-03-10 DIAGNOSIS — E43 Unspecified severe protein-calorie malnutrition: Secondary | ICD-10-CM | POA: Insufficient documentation

## 2020-03-10 DIAGNOSIS — C183 Malignant neoplasm of hepatic flexure: Principal | ICD-10-CM

## 2020-03-10 DIAGNOSIS — D509 Iron deficiency anemia, unspecified: Secondary | ICD-10-CM

## 2020-03-10 DIAGNOSIS — K6389 Other specified diseases of intestine: Secondary | ICD-10-CM

## 2020-03-10 DIAGNOSIS — D649 Anemia, unspecified: Secondary | ICD-10-CM

## 2020-03-10 DIAGNOSIS — C787 Secondary malignant neoplasm of liver and intrahepatic bile duct: Secondary | ICD-10-CM

## 2020-03-10 LAB — MAGNESIUM: Magnesium: 2 mg/dL (ref 1.7–2.4)

## 2020-03-10 LAB — CEA: CEA: 1818 ng/mL — ABNORMAL HIGH (ref 0.0–4.7)

## 2020-03-10 LAB — COMPREHENSIVE METABOLIC PANEL
ALT: 15 U/L (ref 0–44)
AST: 56 U/L — ABNORMAL HIGH (ref 15–41)
Albumin: 1.6 g/dL — ABNORMAL LOW (ref 3.5–5.0)
Alkaline Phosphatase: 273 U/L — ABNORMAL HIGH (ref 38–126)
Anion gap: 11 (ref 5–15)
BUN: 6 mg/dL (ref 6–20)
CO2: 24 mmol/L (ref 22–32)
Calcium: 7.5 mg/dL — ABNORMAL LOW (ref 8.9–10.3)
Chloride: 101 mmol/L (ref 98–111)
Creatinine, Ser: 0.35 mg/dL — ABNORMAL LOW (ref 0.44–1.00)
GFR calc Af Amer: >60 mL/min (ref >60)
GFR calc non Af Amer: >60 mL/min (ref >60)
Glucose, Bld: 65 mg/dL — ABNORMAL LOW (ref 70–99)
Potassium: 3.5 mmol/L (ref 3.5–5.1)
Sodium: 136 mmol/L (ref 135–145)
Total Bilirubin: 1.6 mg/dL — ABNORMAL HIGH (ref 0.3–1.2)
Total Protein: 5 g/dL — ABNORMAL LOW (ref 6.5–8.1)

## 2020-03-10 LAB — CBC
HCT: 26.6 % — ABNORMAL LOW (ref 36.0–46.0)
Hemoglobin: 8 g/dL — ABNORMAL LOW (ref 12.0–15.0)
MCH: 25.3 pg — ABNORMAL LOW (ref 26.0–34.0)
MCHC: 30.1 g/dL (ref 30.0–36.0)
MCV: 84.2 fL (ref 80.0–100.0)
Platelets: 389 10*3/uL (ref 150–400)
RBC: 3.16 MIL/uL — ABNORMAL LOW (ref 3.87–5.11)
RDW: 21.8 % — ABNORMAL HIGH (ref 11.5–15.5)
WBC: 14 10*3/uL — ABNORMAL HIGH (ref 4.0–10.5)
nRBC: 0.1 % (ref 0.0–0.2)

## 2020-03-10 LAB — URINE CULTURE: Culture: NO GROWTH

## 2020-03-10 LAB — CALCIUM, IONIZED: Calcium, Ionized, Serum: 4.3 mg/dL — ABNORMAL LOW (ref 4.5–5.6)

## 2020-03-10 MED ORDER — BOOST / RESOURCE BREEZE PO LIQD CUSTOM
1.0000 | Freq: Three times a day (TID) | ORAL | Status: DC
  Administered 2020-03-10: 1 via ORAL

## 2020-03-10 MED ORDER — OXYCODONE HCL 5 MG PO TABS: 5.0000 mg | ORAL_TABLET | Freq: Four times a day (QID) | ORAL | 0 refills | Status: DC | PRN

## 2020-03-10 MED ORDER — VITAMIN D (ERGOCALCIFEROL) 1.25 MG (50000 UNIT) PO CAPS
50000.0000 [IU] | ORAL_CAPSULE | ORAL | Status: DC
  Administered 2020-03-10: 50000 [IU] via ORAL
  Filled 2020-03-10: qty 1

## 2020-03-10 MED ORDER — SENNOSIDES-DOCUSATE SODIUM 8.6-50 MG PO TABS: 2.0000 | ORAL_TABLET | Freq: Every evening | ORAL | 0 refills | Status: AC | PRN

## 2020-03-10 MED ORDER — GUAIFENESIN-DM 100-10 MG/5ML PO SYRP: 5.0000 mL | ORAL_SOLUTION | ORAL | Status: DC | PRN

## 2020-03-10 MED ORDER — VITAMIN D (ERGOCALCIFEROL) 1.25 MG (50000 UNIT) PO CAPS: 50000.0000 [IU] | ORAL_CAPSULE | ORAL | 0 refills | Status: AC

## 2020-03-10 MED ORDER — POLYETHYLENE GLYCOL 3350 17 G PO PACK: 17.0000 g | PACK | Freq: Every day | ORAL | 0 refills | Status: AC | PRN

## 2020-03-10 MED ORDER — IOHEXOL 300 MG/ML  SOLN
75.0000 mL | Freq: Once | INTRAMUSCULAR | Status: AC | PRN
  Administered 2020-03-10: 75 mL via INTRAVENOUS

## 2020-03-10 MED ORDER — CEPHALEXIN 500 MG PO CAPS: 500.0000 mg | ORAL_CAPSULE | Freq: Four times a day (QID) | ORAL | 0 refills | Status: AC

## 2020-03-10 NOTE — Progress Notes (Signed)
Dr. Constance Haw in to talk with patient and her family members (mother and sister) regarding surgical options and treatment plans. Pt and family tearful but receptive to information given by Dr. Constance Haw, questions answered to their satisfaction. Pt aware of pending CT scan of chest to rule out lung metastases. Pt also agreeable to palliative care consult. Pt requesting to go home today. Dr. Constance Haw to talk with hospitalist regarding possible discharge after CT scan chest completed.

## 2020-03-10 NOTE — Consult Note (Signed)
[facility] Consult  Reason for Consult: Metastatic colon cancer with liver lesions  Referring Physician:  Dr. Reesa Chew (Hospitalist)   Chief Complaint    Abdominal Pain      HPI: Natalie Washington is a 54 y.o. female with a history of alcohol abuse and anemia who was seen in the hospital in 02-02-23 and given 2U pRBC and refused endoscopy. She returned to the hospital with abdominal fullness, pain and swellign of her extremities.  She had been eating and having BM and passing flatus. She was worked up in the and found to have a CT with a nonobstructing colon mass at the hepatic flexure, numerous hepatic metastases, bile duct dilation, moderated ascites and enlarged lymph nodes.    She underwent colonoscopy with Dr. Gala Romney and he found a near obstructing, necrotic hepatic flexure cancer that he did biopsy.  Those are not back at this time.   The patient has been seen today by GI to inform her of the colon cancer and she has been seen by Dr. Delton Coombes with oncology. He has offered her some supportive care and chemotherapy.  I have been consulted due to the near obstructing mass and possible port placement.  The patient is tolerating a diet and having bowel function. Prior to seeing her I recommended for Dr. Reesa Chew to get Palliative Consult and CT Chest to finish her staging.  Discussed the colonoscopy with Dr. Gala Romney and he says he was able to traverse the area and liquid stool could go by the area at this time.   I have spoken with the RN, and she indicates that the patient has been told she will get a port and treatment.  The patient's mother is in the room and her sister is on the phone during our conversation.   Past Medical History:  Diagnosis Date  . Acid reflux   . Pneumonia    1980    Past Surgical History:  Procedure Laterality Date  . ORIF HUMERUS FRACTURE Right 04/07/2019   Procedure: OPEN REDUCTION INTERNAL FIXATION (ORIF) PROXIMAL HUMERUS FRACTURE; BICEP TENODESIS;   Surgeon: Hiram Gash, MD;  Location: [facility];  Service: Orthopedics;  Laterality: Right;  . WISDOM TOOTH EXTRACTION      Family History  Problem Relation Age of Onset  . Colon cancer Father        unsure age of onset. Passed away in 02-01-05  Sister- breast cancer   Social History   Tobacco Use  . Smoking status: Current Every Day Smoker    Packs/day: 0.25    Years: 15.00    Pack years: 3.75  . Smokeless tobacco: Never Used  Substance Use Topics  . Alcohol use: Yes    Comment: once a week  . Drug use: Never    Medications: I have reviewed the patient's current medications. Current Facility-Administered Medications  Medication Dose Route Frequency Provider Last Rate Last Admin  . acetaminophen (TYLENOL) tablet 650 mg  650 mg Oral Q6H PRN Daneil Dolin, MD   650 mg at 03/08/20 02/01/2306   Or  . acetaminophen (TYLENOL) suppository 650 mg  650 mg Rectal Q6H PRN Daneil Dolin, MD      . bisacodyl (DULCOLAX) EC tablet 10 mg  10 mg Oral Once Daneil Dolin, MD      . bisacodyl (DULCOLAX) suppository 10 mg  10 mg Rectal Daily PRN Daneil Dolin, MD      . cefTRIAXone (ROCEPHIN) 2 g in sodium chloride 0.9 %  100 mL IVPB  2 g Intravenous Q24H Daneil Dolin, MD 200 mL/hr at 03/09/20 1742 2 g at 03/09/20 1742  . feeding supplement (BOOST / RESOURCE BREEZE) liquid 1 Container  1 Container Oral TID BM Amin, Jeanella Flattery, MD   1 Container at 03/10/20 1250  . guaiFENesin-dextromethorphan (ROBITUSSIN DM) 100-10 MG/5ML syrup 5 mL  5 mL Oral Q4H PRN Amin, Ankit Chirag, MD      . ipratropium-albuterol (DUONEB) 0.5-2.5 (3) MG/3ML nebulizer solution 3 mL  3 mL Nebulization Q4H PRN Rourk, Cristopher Estimable, MD      . ondansetron [facility]) tablet 4 mg  4 mg Oral Q6H PRN Daneil Dolin, MD       Or  . ondansetron [facility]) injection 4 mg  4 mg Intravenous Q6H PRN Daneil Dolin, MD      . oxyCODONE (Oxy IR/ROXICODONE) immediate release tablet 5 mg  5 mg Oral Q4H PRN Daneil Dolin, MD   5 mg at 03/10/20 0044   . pantoprazole (PROTONIX) EC tablet 40 mg  40 mg Oral BID Daneil Dolin, MD   40 mg at 03/10/20 1250  . polyethylene glycol (MIRALAX / GLYCOLAX) packet 17 g  17 g Oral Daily PRN Rourk, Cristopher Estimable, MD      . senna-docusate (Senokot-S) tablet 2 tablet  2 tablet Oral QHS PRN Rourk, Cristopher Estimable, MD      . Vitamin D (Ergocalciferol) (DRISDOL) capsule 50,000 Units  50,000 Units Oral Q7 days Damita Lack, MD   50,000 Units at 03/10/20 1250   Current Outpatient Medications  Medication Sig Dispense Refill Last Dose  . pantoprazole (PROTONIX) 40 MG tablet Take 1 tablet (40 mg total) by mouth 2 (two) times daily. 120 tablet 0 03/07/2020 at Unknown time  . cephALEXin (KEFLEX) 500 MG capsule Take 1 capsule (500 mg total) by mouth 4 (four) times daily for 4 days. 16 capsule 0   . oxyCODONE (OXY IR/ROXICODONE) 5 MG immediate release tablet Take 1 tablet (5 mg total) by mouth every 6 (six) hours as needed for moderate pain or severe pain. 20 tablet 0   . polyethylene glycol (MIRALAX / GLYCOLAX) 17 g packet Take 17 g by mouth daily as needed for moderate constipation or severe constipation. 14 each 0   . senna-docusate (SENOKOT-S) 8.6-50 MG tablet Take 2 tablets by mouth at bedtime as needed for mild constipation or moderate constipation. 30 tablet 0   . [START ON 03/17/2020] Vitamin D, Ergocalciferol, (DRISDOL) 1.25 MG (50000 UNIT) CAPS capsule Take 1 capsule (50,000 Units total) by mouth every 7 (seven) days for 7 doses. 7 capsule 0     No Known Allergies   ROS:  A comprehensive review of systems was negative except for: Gastrointestinal: positive for abdominal pain and distention  Blood pressure 107/88, pulse (!) 105, temperature 98.7 F (37.1 C), temperature source Oral, resp. rate 20, height 5\' 1"  (1.549 m), weight 52.6 kg, SpO2 100 %. Physical Exam Vitals and nursing note reviewed.  Constitutional:      Appearance: She is underweight.  HENT:     Head: Normocephalic.  Cardiovascular:     Rate  and Rhythm: Normal rate.  Pulmonary:     Effort: Pulmonary effort is normal.  Abdominal:     General: There is distension.     Palpations: Abdomen is soft.     Tenderness: There is abdominal tenderness in the right upper quadrant and right lower quadrant.  Skin:    General: Skin  is warm and dry.  Neurological:     General: No focal deficit present.     Mental Status: She is alert and oriented to person, place, and time.  Psychiatric:        Mood and Affect: Mood is depressed.        Behavior: Behavior normal.     Comments: tearful     Results: Results for orders placed or performed during the hospital encounter of 03/08/20 (from the past 48 hour(s))  Basic metabolic panel     Status: Abnormal   Collection Time: 03/09/20 12:10 AM  Result Value Ref Range   Sodium 133 (L) 135 - 145 mmol/L   Potassium 3.8 3.5 - 5.1 mmol/L    Comment: DELTA CHECK NOTED   Chloride 100 98 - 111 mmol/L   CO2 23 22 - 32 mmol/L   Glucose, Bld 115 (H) 70 - 99 mg/dL    Comment: Glucose reference range applies only to samples taken after fasting for at least 8 hours.   BUN 7 6 - 20 mg/dL   Creatinine, Ser 0.34 (L) 0.44 - 1.00 mg/dL   Calcium 7.0 (L) 8.9 - 10.3 mg/dL   GFR calc non Af Amer >60 >60 mL/min   GFR calc Af Amer >60 >60 mL/min   Anion gap 10 5 - 15    Comment: Performed at [facility], [address], [place], [place] [address]  Lactic acid, plasma     Status: Abnormal   Collection Time: 03/09/20 12:10 AM  Result Value Ref Range   Lactic Acid, Venous 3.4 (HH) 0.5 - 1.9 mmol/L    Comment: CRITICAL VALUE NOTED.  VALUE IS CONSISTENT WITH PREVIOUSLY REPORTED AND CALLED VALUE. Performed at [facility], [address], [place], [place] [address]   VITAMIN D 25 Hydroxy (Vit-D Deficiency, Fractures)     Status: Abnormal   Collection Time: 03/09/20 12:10 AM  Result Value Ref Range   Vit D, 25-Hydroxy 7.47 (L) 30 - 100 ng/mL    Comment: (NOTE) Vitamin D deficiency has been defined by the  [place] practice guideline as a level of serum 25-OH  vitamin D less than 20 ng/mL (1,2). The Endocrine Society went on to  further define vitamin D insufficiency as a level between 21 and 29  ng/mL (2). 1. IOM (Institute of Medicine). 2010. Dietary reference intakes for  calcium and D. [place]: The [employer]. 2. Holick MF, Binkley [place], Bischoff-Ferrari HA, et al. Evaluation,  treatment, and prevention of vitamin D deficiency: an Endocrine  Society clinical practice guideline, JCEM. 2011 Jul; 96(7): 1911-30. Performed at [facility], [place] [address] [place], [place] [address]   Calcium, ionized     Status: Abnormal   Collection Time: 03/09/20 12:10 AM  Result Value Ref Range   Calcium, Ionized, Serum 4.3 (L) 4.5 - 5.6 mg/dL    Comment: (NOTE) Performed At: [facility] [place], [place] [id number] Rush Farmer MD [id number]   Magnesium     Status: None   Collection Time: 03/09/20  3:48 AM  Result Value Ref Range   Magnesium 2.0 1.7 - 2.4 mg/dL    Comment: Performed at [facility], [address], [place],  [address]  CBC     Status: Abnormal   Collection Time: 03/09/20  3:48 AM  Result Value Ref Range   WBC 12.2 (H) 4.0 - 10.5 K/uL   RBC 3.04 (L) 3.87 -  5.11 MIL/uL   Hemoglobin 7.7 (L) 12.0 - 15.0 g/dL   HCT 25.5 (L) 36.0 - 46.0 %   MCV 83.9 80.0 - 100.0 fL   MCH 25.3 (L) 26.0 - 34.0 pg   MCHC 30.2 30.0 - 36.0 g/dL   RDW 21.1 (H) 11.5 - 15.5 %   Platelets 339 150 - 400 K/uL   nRBC 0.2 0.0 - 0.2 %    Comment: Performed at [facility], [address], [place], [place] [address]  Comprehensive metabolic panel     Status: Abnormal   Collection Time: 03/09/20  3:48 AM  Result Value Ref Range   Sodium 135 135 - 145 mmol/L   Potassium 3.6 3.5 - 5.1 mmol/L   Chloride 101 98 - 111 mmol/L   CO2 24 22 - 32 mmol/L   Glucose, Bld 97 70 - 99 mg/dL    Comment: Glucose reference range  applies only to samples taken after fasting for at least 8 hours.   BUN 7 6 - 20 mg/dL   Creatinine, Ser 0.32 (L) 0.44 - 1.00 mg/dL   Calcium 7.1 (L) 8.9 - 10.3 mg/dL   Total Protein 5.0 (L) 6.5 - 8.1 g/dL   Albumin 1.7 (L) 3.5 - 5.0 g/dL   AST 54 (H) 15 - 41 U/L   ALT 15 0 - 44 U/L   Alkaline Phosphatase 283 (H) 38 - 126 U/L   Total Bilirubin 1.5 (H) 0.3 - 1.2 mg/dL   GFR calc non Af Amer >60 >60 mL/min   GFR calc Af Amer >60 >60 mL/min   Anion gap 10 5 - 15    Comment: Performed at [facility], [address], [place], [place] [address]  CBC     Status: Abnormal   Collection Time: 03/10/20  4:13 AM  Result Value Ref Range   WBC 14.0 (H) 4.0 - 10.5 K/uL   RBC 3.16 (L) 3.87 - 5.11 MIL/uL   Hemoglobin 8.0 (L) 12.0 - 15.0 g/dL   HCT 26.6 (L) 36.0 - 46.0 %   MCV 84.2 80.0 - 100.0 fL   MCH 25.3 (L) 26.0 - 34.0 pg   MCHC 30.1 30.0 - 36.0 g/dL   RDW 21.8 (H) 11.5 - 15.5 %   Platelets 389 150 - 400 K/uL   nRBC 0.1 0.0 - 0.2 %    Comment: Performed at [facility], [address], [place], [place] [address]  Magnesium     Status: None   Collection Time: 03/10/20  4:13 AM  Result Value Ref Range   Magnesium 2.0 1.7 - 2.4 mg/dL    Comment: Performed at [facility], [address], [place], [place] [address]  Comprehensive metabolic panel     Status: Abnormal   Collection Time: 03/10/20  4:13 AM  Result Value Ref Range   Sodium 136 135 - 145 mmol/L   Potassium 3.5 3.5 - 5.1 mmol/L   Chloride 101 98 - 111 mmol/L   CO2 24 22 - 32 mmol/L   Glucose, Bld 65 (L) 70 - 99 mg/dL    Comment: Glucose reference range applies only to samples taken after fasting for at least 8 hours.   BUN 6 6 - 20 mg/dL   Creatinine, Ser 0.35 (L) 0.44 - 1.00 mg/dL   Calcium 7.5 (L) 8.9 - 10.3 mg/dL   Total Protein 5.0 (L) 6.5 - 8.1 g/dL   Albumin 1.6 (L) 3.5 - 5.0 g/dL   AST 56 (H) 15 - 41 U/L  ALT 15 0 - 44 U/L   Alkaline Phosphatase 273 (H) 38 - 126 U/L   Total Bilirubin 1.6 (H) 0.3 - 1.2 mg/dL   GFR  calc non Af Amer >60 >60 mL/min   GFR calc Af Amer >60 >60 mL/min   Anion gap 11 5 - 15    Comment: Performed at [facility], [address] [place], [place] [address]    CT a/p and chest reviewed- Hepatic flexure mass with some enlarged nodes, moderate ascites, several areas on the lung that could be nodules (chest report not back), numerous mets to the liver takes up over 50% of the liver parenchyma   CLINICAL DATA:  Abdominal pain. Evaluate for bowel obstruction.  EXAM: CT ABDOMEN AND PELVIS WITH CONTRAST  TECHNIQUE: Multidetector CT imaging of the abdomen and pelvis was performed using the standard protocol following bolus administration of intravenous contrast.  CONTRAST:  26mL OMNIPAQUE IOHEXOL 300 MG/ML  SOLN  COMPARISON:  None.  FINDINGS: Lower chest: Calcification identified within the LAD coronary artery. The heart size appears within normal limits. No pericardial effusion. Small right pleural effusion. Imaged portions of the lungs are otherwise clear.  Hepatobiliary: The liver is enlarged. There are innumerable ill-defined, peripherally enhancing lesions throughout the liver compatible with metastatic disease. Lesions are too numerous to count including:  Index segment 4 lesion measures 7.7 x 6.4 cm, image 26/2.  Index lesion within segment 8 measures 7.4 x 8.4 cm.  Index lesion within segment 2 measures 4.3 by 3.4 cm, image 25/2.  Evidence of intrahepatic biliary obstruction is noted with dilatation of the intrahepatic bile ducts and normal common bile duct, image 23/2. Gallbladder negative.  Pancreas: Unremarkable. No pancreatic ductal dilatation or surrounding inflammatory changes.  Spleen: Normal in size without focal abnormality.  Adrenals/Urinary Tract: Normal adrenal glands. No kidney mass or hydronephrosis identified bilaterally. Bladder unremarkable.  Stomach/Bowel: Stomach is nondistended. No bowel wall thickening, no small  bowel wall thickening, inflammation or distension. Circumferential mass involving the hepatic flexure is identified this, image 48/2 and image 53/6. This involves a approximate 5.8 cm segment of colon, image 48/6. No obstruction.  Vascular/Lymphatic: Aortic atherosclerosis without aneurysm. Enlarged ileocolic lymph node measures 2.2 x 2.3 cm. Gastrohepatic ligament lymph node measures 1.3 cm short axis, image 31/2. Prominent but non pathologically enlarged external iliac lymph nodes are identified including 9 mm left external iliac node, image 75/2.  Reproductive: The uterus is enlarged and contains 2 high attenuation masses which are favored to represent fibroids. The largest measures 4 cm, image 69/7. No adnexal mass identified.  Other: Moderate volume of ascites is noted within the abdomen and pelvis. Although no definite peritoneal nodule or mass identified, peritoneal carcinomatosis cannot be excluded. Diffuse body wall edema is identified compatible with anasarca.  Musculoskeletal: No acute or suspicious bone lesions identified.  IMPRESSION: 1. Nonobstructing circumferential mass involving the hepatic flexure is identified concerning for colon cancer. Further investigation with colonoscopy is advised. 2. Innumerable liver metastases are noted. Additionally, there are enlarged ileocolic and gastrohepatic ligament lymph node concerning for metastatic adenopathy. 3. Moderate volume of ascites noted within the abdomen and pelvis. Although no definite peritoneal nodule or mass identified, peritoneal carcinomatosis cannot be excluded. Consider further evaluation with diagnostic paracentesis. 4. Small right pleural effusion. 5. Aortic atherosclerosis. LAD coronary artery calcifications noted. 6. Enlarged uterus containing 2 high attenuation masses which are favored to represent fibroids.  Aortic Atherosclerosis (ICD10-I70.0).   Electronically Signed   By: Queen Slough.D.  On: 03/08/2020 15:14  Assessment & Plan:  Natalie Washington is a 54 y.o. female with metastatic colon cancer to the liver with over 50% of the liver involved.  She has a hepatic flexure mass that is near obstructing on colonoscopy but she is not clinically obstructed or obstructed on CT. She has evidence of ascites and could have carcinomatosis.    Discussed at length with her and her family with RN Crystal in the room the disease and the extent of the disease. I showed her the CT scan and the liver lesions and the mass. We discussed that this is not a cancer that can be cured and that all treatment is palliative in nature to try to slow the progress. We discussed that she could obstruct in the future from the lesion but that she is not obstructed currently. We discussed the options of a resection with anastomosis versus colostomy and the risk of the resection and anastomosis not healing, having a leak, and causing her to be septic and shorter her life. We discussed that she has a very limited amount of colon proximal to the hepatic flexure mass but that a diverting colostomy could potentially be done and that this could result in high output (even though it is not an ileostomy) and dehydration. We discussed that there is no surgical treatment for the liver lesions.  We discussed that all surgical procedures were palliative in nature and would not cure her from the cancer but could prevent an obstruction.   We discussed that Dr. Tomie China note indicates he would give her chemotherapy to try and slow the progression and aid in survival but that she does not have to pursue this treatment if she does not wish too. We discussed that she does not have to proceed with any surgery either.   We discussed that Palliative medicine has been consulted and I attempted to call and see if they would see her today but unfortunately they are not available until Monday at [facility].  We discussed that she  needs to think about her goals of care and her wishes and discussed that as her disease progresses that she will need to decide about do not resuscitate orders. We discussed that Palliative care is able to help families with this discussion.   Currently the patient and her family are very overwhelmed and want to further discuss their options.  She wants to go home.  I talked to CT and was able to arrange for her to get her CT chest prior to discharge. Discussed discharge with Dr. Reesa Chew who was agreeable.   Patient will follow up with Dr. Delton Coombes and determine what course she wants to pursue. If she decides for port we can proceed in the upcoming week. We can continue to discuss the option of a diverting colostomy versus resection for palliation, but these come with risk.   All questions were answered to the satisfaction of the patient and family.  Greater than 50% of the 110 minute visit was spent in counseling/ coordination of care regarding the patient's metastatic colon cancer and what that means and her options as described above.   Virl Cagey 03/10/2020, 5:56 PM

## 2020-03-10 NOTE — Consult Note (Signed)
[facility] Consultation Oncology  Name: Natalie Washington      MRN: [id number]    Location: E332/R518-84  Date: 03/10/2020 Time:12:52 PM   REFERRING PHYSICIAN: Dr. Reesa Chew  REASON FOR CONSULT: Colon cancer   DIAGNOSIS: Metastatic colon cancer  HISTORY OF PRESENT ILLNESS: Natalie Washington is a 54 year old female who is seen in consultation today at the request of Dr. Reesa Chew for further work-up and management of presumed metastatic colon cancer.  She has a history of transfusion dependent anemia, heme positive stool, declined endoscopic evaluation during admission in March, was on outpatient EGD and colonoscopy next week.  However she presented to the ER with abdominal distention, discomfort and lower extremity edema.  Hemoglobin was Washington to be low at 7.8.  She received 1 unit of PRBC.  CT scan showed circumferential mass involving the hepatic flexure and innumerable liver metastases, moderate volume ascites, enlarged lymph nodes in the ileocolic and gastrohepatic ligament concerning for metastatic colon cancer.  LFTs were elevated.  Colonoscopy on 03/09/2020 showed diverticulosis in the sigmoid colon and descending colon, single 5 mm polyp splenic flexure.  Nearly obstructing exophytic and necrotic appearing mass at the hepatic flexure, distal ascending segment, status post biopsy.  CEA elevated at 1818.  Hemoglobin improved to 8 today.  LFTs are elevated with AST of 56 and alk phos of 273.  Total bilirubin is 1.6.  She reports that she has done a wide variety of work.  She currently lives with her mother.  She smokes about 2 cigarettes/day.  She had history of heavy alcohol use in the past but has not been drinking much lately.  Family history significant for father with colon cancer and a sister with breast cancer.  PAST MEDICAL HISTORY:   Past Medical History:  Diagnosis Date  . Acid reflux   . Pneumonia    1980    ALLERGIES: No Known Allergies    MEDICATIONS: I have reviewed the patient's  current medications.     PAST SURGICAL HISTORY Past Surgical History:  Procedure Laterality Date  . ORIF HUMERUS FRACTURE Right 04/07/2019   Procedure: OPEN REDUCTION INTERNAL FIXATION (ORIF) PROXIMAL HUMERUS FRACTURE; BICEP TENODESIS;  Surgeon: Hiram Gash, MD;  Location: [facility] ORS;  Service: Orthopedics;  Laterality: Right;  . WISDOM TOOTH EXTRACTION      FAMILY HISTORY: Family History  Problem Relation Age of Onset  . Colon cancer Father        unsure age of onset. Passed away in December 23, 2004    SOCIAL HISTORY:  reports that she has been smoking. She has a 3.75 pack-year smoking history. She has never used smokeless tobacco. She reports current alcohol use. She reports that she does not use drugs.  PERFORMANCE STATUS: The patient's performance status is 2 - Symptomatic, <50% confined to bed  PHYSICAL EXAM: Most Recent Vital Signs: Blood pressure 109/75, pulse (!) 101, temperature 99 F (37.2 C), temperature source Oral, resp. rate 16, height _0  (1.549 m), weight 116 lb (52.6 kg), SpO2 98 %. BP 109/75 (BP Location: Right Arm)   Pulse (!) 101   Temp 99 F (37.2 C) (Oral)   Resp 16   Ht _1  (1.549 m)   Wt 116 lb (52.6 kg)   SpO2 98%   BMI 21.92 kg/m  General appearance: alert, cooperative and appears stated age Head: Normocephalic, without obvious abnormality, atraumatic Neck: no adenopathy and supple, symmetrical, trachea midline Lungs: Bilateral air entry with decreased breath sounds at bases. Heart: regular rate and  rhythm Abdomen: Soft, distended, with no palpable masses. Extremities: 1+ edema bilaterally. Skin: Skin color, texture, turgor normal. No rashes or lesions Neurologic: Grossly normal  LABORATORY DATA:  Results for orders placed or performed during the hospital encounter of 03/08/20 (from the past 48 hour(s))  Blood culture (routine x 2)     Status: None (Preliminary result)   Collection Time: 03/08/20  2:44 PM   Specimen: Right Antecubital; Blood  Result  Value Ref Range   Specimen Description RIGHT ANTECUBITAL    Special Requests      BOTTLES DRAWN AEROBIC AND ANAEROBIC Blood Culture adequate volume   Culture      NO GROWTH 2 DAYS Performed at [facility], [address], [place], [place] [address]    Report Status PENDING   Blood culture (routine x 2)     Status: None (Preliminary result)   Collection Time: 03/08/20  2:44 PM   Specimen: BLOOD RIGHT HAND  Result Value Ref Range   Specimen Description BLOOD RIGHT HAND    Special Requests      BOTTLES DRAWN AEROBIC AND ANAEROBIC Blood Culture adequate volume   Culture      NO GROWTH 2 DAYS Performed at [facility], [address] [place], [place] [address]    Report Status PENDING   CEA     Status: Abnormal   Collection Time: 03/08/20  2:57 PM  Result Value Ref Range   CEA 1,818.0 (H) 0.0 - 4.7 ng/mL    Comment: (NOTE) Results confirmed on dilution.                             Nonsmokers          <3.9                             Smokers             <5.6 [employer] Electrochemiluminescence Immunoassay (ECLIA) Values obtained with different assay methods or kits cannot be used interchangeably.  Results cannot be interpreted as absolute evidence of the presence or absence of malignant disease. Performed At: [facility] [address] [place], [place] [id number] Rush Farmer MD [id number]   SARS Coronavirus 2 by RT PCR (hospital order, performed in [facility] hospital lab) Nasopharyngeal Nasopharyngeal Swab     Status: None   Collection Time: 03/08/20  3:30 PM   Specimen: Nasopharyngeal Swab  Result Value Ref Range   SARS Coronavirus 2 NEGATIVE NEGATIVE    Comment: (NOTE) SARS-CoV-2 target nucleic acids are NOT DETECTED. The SARS-CoV-2 RNA is generally detectable in upper and lower respiratory specimens during the acute phase of infection. The lowest concentration of SARS-CoV-2 viral copies this assay can detect is 250 copies / mL. A negative result  does not preclude SARS-CoV-2 infection and should not be used as the sole basis for treatment or other patient management decisions.  A negative result may occur with improper specimen collection / handling, submission of specimen other than nasopharyngeal swab, presence of viral mutation(s) within the areas targeted by this assay, and inadequate number of viral copies (<250 copies / mL). A negative result must be combined with clinical observations, patient history, and epidemiological information. Fact Sheet for Patients:   StrictlyIdeas.no Fact Sheet for Healthcare Providers: BankingDealers.co.za This test is not yet approved or cleared  by the [place] FDA and has been authorized for detection  and/or diagnosis of SARS-CoV-2 by FDA under an Emergency Use Authorization (EUA).  This EUA will remain in effect (meaning this test can be used) for the duration of the COVID-19 declaration under Section 564(b)(1) of the Act, 21 U.S.C. section 360bbb-3(b)(1), unless the authorization is terminated or revoked sooner. Performed at [facility], [address], [place], [place] [address]   Protime-INR     Status: None   Collection Time: 03/08/20  4:15 PM  Result Value Ref Range   Prothrombin Time 14.1 11.4 - 15.2 seconds   INR 1.1 0.8 - 1.2    Comment: (NOTE) INR goal varies based on device and disease states. Performed at [facility], [address], [place], [place] [address]   Type and screen [facility]     Status: None (Preliminary result)   Collection Time: 03/08/20  5:24 PM  Result Value Ref Range   ABO/RH(D) AB POS    Antibody Screen NEG    Sample Expiration 03/11/2020,2359    Unit Number [id number]    Blood Component Type RED CELLS,LR    Unit division 00    Status of Unit ALLOCATED    Transfusion Status OK TO TRANSFUSE    Crossmatch Result Compatible    Unit Number [id number]    Blood Component Type RED  CELLS,LR    Unit division 00    Status of Unit ISSUED,FINAL    Transfusion Status OK TO TRANSFUSE    Crossmatch Result      Compatible Performed at [facility], [address] [place], [place] [address]   Prepare RBC (crossmatch)     Status: None   Collection Time: 03/08/20  5:24 PM  Result Value Ref Range   Order Confirmation      ORDER PROCESSED BY BLOOD BANK Performed at [facility], [address], [place], [place] [address]   Basic metabolic panel     Status: Abnormal   Collection Time: 03/09/20 12:10 AM  Result Value Ref Range   Sodium 133 (L) 135 - 145 mmol/L   Potassium 3.8 3.5 - 5.1 mmol/L    Comment: DELTA CHECK NOTED   Chloride 100 98 - 111 mmol/L   CO2 23 22 - 32 mmol/L   Glucose, Bld 115 (H) 70 - 99 mg/dL    Comment: Glucose reference range applies only to samples taken after fasting for at least 8 hours.   BUN 7 6 - 20 mg/dL   Creatinine, Ser 0.34 (L) 0.44 - 1.00 mg/dL   Calcium 7.0 (L) 8.9 - 10.3 mg/dL   GFR calc non Af Amer >60 >60 mL/min   GFR calc Af Amer >60 >60 mL/min   Anion gap 10 5 - 15    Comment: Performed at [facility], [address] [place], [place] [address]  Lactic acid, plasma     Status: Abnormal   Collection Time: 03/09/20 12:10 AM  Result Value Ref Range   Lactic Acid, Venous 3.4 (HH) 0.5 - 1.9 mmol/L    Comment: CRITICAL VALUE NOTED.  VALUE IS CONSISTENT WITH PREVIOUSLY REPORTED AND CALLED VALUE. Performed at [facility], [address], [place], [place] [address]   VITAMIN D 25 Hydroxy (Vit-D Deficiency, Fractures)     Status: Abnormal   Collection Time: 03/09/20 12:10 AM  Result Value Ref Range   Vit D, 25-Hydroxy 7.47 (L) 30 - 100 ng/mL    Comment: (NOTE) Vitamin D deficiency has been defined by the Institute of Medicine  and an Endocrine Society practice  guideline as a level of serum 25-OH  vitamin D less than 20 ng/mL (1,2). The Endocrine Society went on to  further define vitamin D insufficiency as a level between 21 and 29   ng/mL (2). 1. IOM (Institute of Medicine). 2010. Dietary reference intakes for  calcium and D. [place]: The [employer]. 2. Holick MF, [place], Bischoff-Ferrari HA, et al. Evaluation,  treatment, and prevention of vitamin D deficiency: an Endocrine  Society clinical practice guideline, JCEM. 2011 Jul; 96(7): 1911-30. Performed at [facility], [place] [address] [place], [place] [address]   Magnesium     Status: None   Collection Time: 03/09/20  3:48 AM  Result Value Ref Range   Magnesium 2.0 1.7 - 2.4 mg/dL    Comment: Performed at [facility], [address], [place], [place] [address]  CBC     Status: Abnormal   Collection Time: 03/09/20  3:48 AM  Result Value Ref Range   WBC 12.2 (H) 4.0 - 10.5 K/uL   RBC 3.04 (L) 3.87 - 5.11 MIL/uL   Hemoglobin 7.7 (L) 12.0 - 15.0 g/dL   HCT 25.5 (L) 36.0 - 46.0 %   MCV 83.9 80.0 - 100.0 fL   MCH 25.3 (L) 26.0 - 34.0 pg   MCHC 30.2 30.0 - 36.0 g/dL   RDW 21.1 (H) 11.5 - 15.5 %   Platelets 339 150 - 400 K/uL   nRBC 0.2 0.0 - 0.2 %    Comment: Performed at [facility], [address] [place], [place] [address]  Comprehensive metabolic panel     Status: Abnormal   Collection Time: 03/09/20  3:48 AM  Result Value Ref Range   Sodium 135 135 - 145 mmol/L   Potassium 3.6 3.5 - 5.1 mmol/L   Chloride 101 98 - 111 mmol/L   CO2 24 22 - 32 mmol/L   Glucose, Bld 97 70 - 99 mg/dL    Comment: Glucose reference range applies only to samples taken after fasting for at least 8 hours.   BUN 7 6 - 20 mg/dL   Creatinine, Ser 0.32 (L) 0.44 - 1.00 mg/dL   Calcium 7.1 (L) 8.9 - 10.3 mg/dL   Total Protein 5.0 (L) 6.5 - 8.1 g/dL   Albumin 1.7 (L) 3.5 - 5.0 g/dL   AST 54 (H) 15 - 41 U/L   ALT 15 0 - 44 U/L   Alkaline Phosphatase 283 (H) 38 - 126 U/L   Total Bilirubin 1.5 (H) 0.3 - 1.2 mg/dL   GFR calc non Af Amer >60 >60 mL/min   GFR calc Af Amer >60 >60 mL/min   Anion gap 10 5 - 15    Comment: Performed at [facility],  [address] [place], [place] [address]  CBC     Status: Abnormal   Collection Time: 03/10/20  4:13 AM  Result Value Ref Range   WBC 14.0 (H) 4.0 - 10.5 K/uL   RBC 3.16 (L) 3.87 - 5.11 MIL/uL   Hemoglobin 8.0 (L) 12.0 - 15.0 g/dL   HCT 26.6 (L) 36.0 - 46.0 %   MCV 84.2 80.0 - 100.0 fL   MCH 25.3 (L) 26.0 - 34.0 pg   MCHC 30.1 30.0 - 36.0 g/dL   RDW 21.8 (H) 11.5 - 15.5 %   Platelets 389 150 - 400 K/uL   nRBC 0.1 0.0 - 0.2 %    Comment: Performed at [facility], [address], [place], [place] [address]  Magnesium     Status: None   Collection Time: 03/10/20  4:13 AM  Result Value Ref Range   Magnesium 2.0 1.7 - 2.4 mg/dL    Comment: Performed at [facility], [address] [place], [place] [address]  Comprehensive metabolic panel     Status: Abnormal   Collection Time: 03/10/20  4:13 AM  Result Value Ref Range   Sodium 136 135 - 145 mmol/L   Potassium 3.5 3.5 - 5.1 mmol/L   Chloride 101 98 - 111 mmol/L   CO2 24 22 - 32 mmol/L   Glucose, Bld 65 (L) 70 - 99 mg/dL    Comment: Glucose reference range applies only to samples taken after fasting for at least 8 hours.   BUN 6 6 - 20 mg/dL   Creatinine, Ser 0.35 (L) 0.44 - 1.00 mg/dL   Calcium 7.5 (L) 8.9 - 10.3 mg/dL   Total Protein 5.0 (L) 6.5 - 8.1 g/dL   Albumin 1.6 (L) 3.5 - 5.0 g/dL   AST 56 (H) 15 - 41 U/L   ALT 15 0 - 44 U/L   Alkaline Phosphatase 273 (H) 38 - 126 U/L   Total Bilirubin 1.6 (H) 0.3 - 1.2 mg/dL   GFR calc non Af Amer >60 >60 mL/min   GFR calc Af Amer >60 >60 mL/min   Anion gap 11 5 - 15    Comment: Performed at [facility], [address] [place],  [address]      RADIOGRAPHY: CT ABDOMEN PELVIS W CONTRAST  Result Date: 03/08/2020 CLINICAL DATA:  Abdominal pain. Evaluate for bowel obstruction. EXAM: CT ABDOMEN AND PELVIS WITH CONTRAST TECHNIQUE: Multidetector CT imaging of the abdomen and pelvis was performed using the standard protocol following bolus administration of intravenous contrast. CONTRAST:   71m OMNIPAQUE IOHEXOL 300 MG/ML  SOLN COMPARISON:  None. FINDINGS: Lower chest: Calcification identified within the LAD coronary artery. The heart size appears within normal limits. No pericardial effusion. Small right pleural effusion. Imaged portions of the lungs are otherwise clear. Hepatobiliary: The liver is enlarged. There are innumerable ill-defined, peripherally enhancing lesions throughout the liver compatible with metastatic disease. Lesions are too numerous to count including: Index segment 4 lesion measures 7.7 x 6.4 cm, image 26/2. Index lesion within segment 8 measures 7.4 x 8.4 cm. Index lesion within segment 2 measures 4.3 by 3.4 cm, image 25/2. Evidence of intrahepatic biliary obstruction is noted with dilatation of the intrahepatic bile ducts and normal common bile duct, image 23/2. Gallbladder negative. Pancreas: Unremarkable. No pancreatic ductal dilatation or surrounding inflammatory changes. Spleen: Normal in size without focal abnormality. Adrenals/Urinary Tract: Normal adrenal glands. No kidney mass or hydronephrosis identified bilaterally. Bladder unremarkable. Stomach/Bowel: Stomach is nondistended. No bowel wall thickening, no small bowel wall thickening, inflammation or distension. Circumferential mass involving the hepatic flexure is identified this, image 48/2 and image 53/6. This involves a approximate 5.8 cm segment of colon, image 48/6. No obstruction. Vascular/Lymphatic: Aortic atherosclerosis without aneurysm. Enlarged ileocolic lymph node measures 2.2 x 2.3 cm. Gastrohepatic ligament lymph node measures 1.3 cm short axis, image 31/2. Prominent but non pathologically enlarged external iliac lymph nodes are identified including 9 mm left external iliac node, image 75/2. Reproductive: The uterus is enlarged and contains 2 high attenuation masses which are favored to represent fibroids. The largest measures 4 cm, image 69/7. No adnexal mass identified. Other: Moderate volume of  ascites is noted within the abdomen and pelvis. Although no definite peritoneal nodule or mass identified, peritoneal  carcinomatosis cannot be excluded. Diffuse body wall edema is identified compatible with anasarca. Musculoskeletal: No acute or suspicious bone lesions identified. IMPRESSION: 1. Nonobstructing circumferential mass involving the hepatic flexure is identified concerning for colon cancer. Further investigation with colonoscopy is advised. 2. Innumerable liver metastases are noted. Additionally, there are enlarged ileocolic and gastrohepatic ligament lymph node concerning for metastatic adenopathy. 3. Moderate volume of ascites noted within the abdomen and pelvis. Although no definite peritoneal nodule or mass identified, peritoneal carcinomatosis cannot be excluded. Consider further evaluation with diagnostic paracentesis. 4. Small right pleural effusion. 5. Aortic atherosclerosis. LAD coronary artery calcifications noted. 6. Enlarged uterus containing 2 high attenuation masses which are favored to represent fibroids. Aortic Atherosclerosis (ICD10-I70.0). Electronically Signed   By: Kerby Moors M.D.   On: 03/08/2020 15:14       PATHOLOGY: Pending.  ASSESSMENT and PLAN:  1.  Presumed metastatic colon cancer: -Presentation to the ER with abdominal distention, lower extremity edema.  Washington to have hemoglobin 7.7 and received 1 unit of PRBC. -CTAP showed circumferential mass involving the hepatic flexure, innumerable liver metastases, moderate volume ascites, enlarged ileocolic and gastrohepatic ligament lymph nodes. -Colonoscopy showed nearly obstructing exophytic necrotic appearing mass at the hepatic flexure/distal ascending segment.  This was biopsied.  Pathology is pending. -CEA elevated at 1818. -CT of the chest was ordered for completion of staging. -Patient reports that she is having good bowel movements.  She will be evaluated by surgery to see if she needs any diverting  colostomy. -We talked about the prognosis of metastatic colon cancer which has a median survival of 2 years with treatment. -We also discussed best supportive care.  Patient would like to get treatments. -She will need port placement prior to discharge if possible.  If not please arrange it as an outpatient. -We will await for biopsy reports and sent for MSI and NGS testing. -If discharged over the weekend, she can see me in the office next week.  2.  Normocytic anemia: -She received 1 unit of PRBC during this admission.  Hemoglobin today is 8.  MCV is 84. -This is anemia from chronic inflammation and relative iron deficiency. -Would recommend Feraheme infusion x2.  All questions were answered. The patient knows to call the clinic with any problems, questions or concerns. We can certainly see the patient much sooner if necessary.    Derek Jack

## 2020-03-10 NOTE — Progress Notes (Signed)
Initial Nutrition Assessment  DOCUMENTATION CODES:   Severe malnutrition in context of chronic illness  INTERVENTION:  Boost Breeze po TID, each supplement provides 250 kcal and 9 grams of protein  Provide Ensure supplement with diet advancement  NUTRITION DIAGNOSIS:   Severe Malnutrition related to chronic illness as evidenced by moderate fat depletion, severe fat depletion, moderate muscle depletion, severe muscle depletion, edema, per patient/family report, energy intake < or equal to 75% for > or equal to 1 month.  GOAL:   Patient will meet greater than or equal to 90% of their needs  MONITOR:   PO intake, Supplement acceptance, Diet advancement, I & O's, Labs, Weight trends  REASON FOR ASSESSMENT:   Malnutrition Screening Tool    ASSESSMENT:  54 year old female with history of alcohol abuse and tobacco abuse with recent hospitalization secondary to profound anemia thought secondary to GI bleed (Hgb 5.1), transfused 2 units and scheduled for outpatient endoscopy/colonscopy on 5/18 presented with complaints of abdominal fullness, pain, distention, lower extremity edema, nausea without vomiting, and no bowel movement over the past couple of days. CT abdomen pelvis significant for nonobstructive colon mass with evidence of liver mets, and intrahepatic bile related duct dilation.  Patient is s/p colonoscopy with deep sedation on 5/13. Per chart, 5 mm polyp at splenic flexure removed with cold snare, nearly obstructing exophytic, necrotic appearing mass at hepatic flexure/distal ascending segment, biopsied. Surgery and oncology consulted.   Patient awake and alert this morning, reports having some broth, ginger ale, apple juice, and jello this morning. She reports usually having a good appetite at home, recalls 2 good meals and snacks. She reports limited meat intake other than chicken, likes greens, all fruits, soups, potato chips, and Cheeze-its. Patient recalls limited po intake  over the past couple of months due to abdominal swelling/discomfort reports mostly consuming chicken noodle soup. She is amenable to Boost Breeze supplement to aid with meeting needs as well as Ensure with diet advancement.   Current wt 115.72 lbs Limited recent wt history for review, stable over the past 2 months. Patient endorses recent wt loss, recalls weighing 140 lbs in February 2021.   Medications reviewed and include: Protonix, Vit D IVF: NaCl IVPB: Rocephin Labs: Corrected Ca 9.42 (WNL), Hgb 8 (L) trending up, WBC 14 (H)   NUTRITION - FOCUSED PHYSICAL EXAM:    Most Recent Value  Orbital Region  Moderate depletion  Upper Arm Region  Severe depletion  Thoracic and Lumbar Region  Severe depletion  Buccal Region  Moderate depletion  Temple Region  Moderate depletion  Clavicle Bone Region  Severe depletion  Clavicle and Acromion Bone Region  Severe depletion  Scapular Bone Region  Unable to assess  Dorsal Hand  Severe depletion  Patellar Region  Unable to assess  Anterior Thigh Region  Unable to assess  Posterior Calf Region  Unable to assess  Edema (RD Assessment)  Moderate [BLE]  Hair  Reviewed  Eyes  Reviewed  Mouth  Reviewed  Skin  Reviewed  Nails  Reviewed      Diet Order:   Diet Order            Diet clear liquid Room service appropriate? Yes; Fluid consistency: Thin  Diet effective now              EDUCATION NEEDS:   Education needs have been addressed  Skin:  Skin Assessment: Reviewed RN Assessment  Last BM:  5/13  Height:   Ht Readings from Last  1 Encounters:  03/09/20 5\' 1"  (1.549 m)    Weight:   Wt Readings from Last 1 Encounters:  03/08/20 52.6 kg    BMI:  Body mass index is 21.92 kg/m.  Estimated Nutritional Needs:   Kcal:  1575-1850  Protein:  85-95  Fluid:  >= 1.5 L/day    Lajuan Lines, RD, LDN Clinical Nutrition After Hours/Weekend Pager # in [phone]

## 2020-03-10 NOTE — Progress Notes (Signed)
PROGRESS NOTE    Natalie Washington  KS:[id number] DOB: 1965-11-25 DOA: 03/08/2020 PCP: Patient, No Pcp Per   Brief Narrative:  54 year old with history of alcohol use, tobacco use recent hospitalization for profound anemia secondary to GI bleed when she refused endoscopic and colonoscopy evaluation.  After going home started having rectal bleeding therefore came back to the hospital.  CT abdomen pelvis showed nonobstructive colonic mass with metastatic disease to liver.  GI was consulted.  Colonoscopy on 5/13 showed nearly obstructive colon mass biopsies taken.  Oncology, general surgery and palliative care surgery consulted.   Assessment & Plan:   Active Problems:   Anemia   Colonic mass   Nearly obstructing colon mass with liver mets/abdominal distention Moderate volume ascites Concerning colonic mass status post biopsy.  Nearly obstructing.  Status post colonoscopy 5/13 biopsy sent. Oncology, palliative care and general surgery consulted. CT chest with contrast for staging Overall patient has poor prognosis.  Severe hypokalemia hypocalcemia Vitamin D deficiency Vitamin D supplements ordered.  50,000 units weekly.  Hypophosphatemia -Repleted.  Elevated LFTs/hypoalbuminemia. Suspect from metastatic disease.  We will continue to monitor this for now.  Urinary tract infection, POA Urine cultures-no growth currently on empiric IV Rocephin.  I will discontinue Flagyl IV fluids.  Anemia of chronic blood loss. Hemoglobin is 8.0.  Status post 1 unit PRBC transfusion.Marland Kitchen  History of alcohol abuse Reports abstinence since last hospitalization March of this year     DVT prophylaxis: scds Code Status: Full  Family Communication:  None at bedside    Status is: Inpatient  Remains inpatient appropriate because:Inpatient level of care appropriate due to severity of illness   Dispo: The patient is from: Home              Anticipated d/c is to: Home               Anticipated d/c date is: 2 days              Patient currently is not medically stable to d/c.  Ongoing evaluation for nearly obstructing colonic mass.  Maintain hospital stay.  I suspect patient will end up going home on hospice but before this we will weigh in oncology input, palliative care and general surgery    Subjective: Tolerated C-scope yesterday feeling okay.  No complaints today.  We had a lengthy discussion regarding C-scope findings.  She is aware that it appears to be a malignant/cancerous mass.  Review of Systems Otherwise negative except as per HPI, including: General: Denies fever, chills, night sweats or unintended weight loss. Resp: Denies cough, wheezing, shortness of breath. Cardiac: Denies chest pain, palpitations, orthopnea, paroxysmal nocturnal dyspnea. GI: Denies abdominal pain, nausea, vomiting, diarrhea or constipation GU: Denies dysuria, frequency, hesitancy or incontinence MS: Denies muscle aches, joint pain or swelling Neuro: Denies headache, neurologic deficits (focal weakness, numbness, tingling), abnormal gait Psych: Denies anxiety, depression, SI/HI/AVH Skin: Denies new rashes or lesions ID: Denies sick contacts, exotic exposures, travel  Examination:  Constitutional: Not in acute distress Respiratory: Clear to auscultation bilaterally Cardiovascular: Normal sinus rhythm, no rubs Abdomen: Nontender nondistended good bowel sounds Musculoskeletal: No edema noted Skin: No rashes seen Neurologic: CN 2-12 grossly intact.  And nonfocal Psychiatric: Normal judgment and insight. Alert and oriented x 3. Normal mood.  Objective: Vitals:   03/09/20 1630 03/09/20 1700 03/09/20 2243 03/10/20 0602  BP: 118/87 109/89 106/83 109/75  Pulse: (!) 104 95 (!) 109 (!) 101  Resp: (!) 25 (!) 22 17 16  Temp:  (!) 97.4 F (36.3 C) 98 F (36.7 C) 99 F (37.2 C)  TempSrc:  Oral Oral Oral  SpO2: 98% 95% 99% 98%  Weight:      Height:  5\' 1"  (1.549 m)       Intake/Output Summary (Last 24 hours) at 03/10/2020 1112 Last data filed at 03/10/2020 0900 Gross per 24 hour  Intake 1784.95 ml  Output 0 ml  Net 1784.95 ml   Filed Weights   03/08/20 1109  Weight: 52.6 kg     Data Reviewed:   CBC: Recent Labs  Lab 03/08/20 1144 03/09/20 0348 03/10/20 0413  WBC 11.8* 12.2* 14.0*  NEUTROABS 9.0*  --   --   HGB 7.8* 7.7* 8.0*  HCT 26.8* 25.5* 26.6*  MCV 83.8 83.9 84.2  PLT 384 339 [id number]   Basic Metabolic Panel: Recent Labs  Lab 03/08/20 1144 03/09/20 0010 03/09/20 0348 03/10/20 0413  NA 136 133* 135 136  K 2.6* 3.8 3.6 3.5  CL 94* 100 101 101  CO2 27 23 24 24   GLUCOSE 109* 115* 97 65*  BUN 8 7 7 6   CREATININE 0.41* 0.34* 0.32* 0.35*  CALCIUM 7.9* 7.0* 7.1* 7.5*  MG 2.4  --  2.0 2.0  PHOS 2.1*  --   --   --    GFR: Estimated Creatinine Clearance: 60.7 mL/min (A) (by C-G formula based on SCr of 0.35 mg/dL (L)). Liver Function Tests: Recent Labs  Lab 03/08/20 1144 03/09/20 0348 03/10/20 0413  AST 61* 54* 56*  ALT 17 15 15   ALKPHOS 380* 283* 273*  BILITOT 1.2 1.5* 1.6*  PROT 6.3* 5.0* 5.0*  ALBUMIN 2.0* 1.7* 1.6*   Recent Labs  Lab 03/08/20 1144  LIPASE 16   No results for input(s): AMMONIA in the last 168 hours. Coagulation Profile: Recent Labs  Lab 03/08/20 1615  INR 1.1   Cardiac Enzymes: No results for input(s): CKTOTAL, CKMB, CKMBINDEX, TROPONINI in the last 168 hours. BNP (last 3 results) No results for input(s): PROBNP in the last 8760 hours. HbA1C: No results for input(s): HGBA1C in the last 72 hours. CBG: No results for input(s): GLUCAP in the last 168 hours. Lipid Profile: No results for input(s): CHOL, HDL, LDLCALC, TRIG, CHOLHDL, LDLDIRECT in the last 72 hours. Thyroid Function Tests: No results for input(s): TSH, T4TOTAL, FREET4, T3FREE, THYROIDAB in the last 72 hours. Anemia Panel: No results for input(s): VITAMINB12, FOLATE, FERRITIN, TIBC, IRON, RETICCTPCT in the last 72 hours. Sepsis  Labs: Recent Labs  Lab 03/08/20 1144 03/09/20 0010  LATICACIDVEN 4.9* 3.4*    Recent Results (from the past 240 hour(s))  Culture, Urine     Status: None   Collection Time: 03/08/20 11:26 AM   Specimen: Urine, Random  Result Value Ref Range Status   Specimen Description   Final    URINE, RANDOM Performed at [facility], [address], [place], [place] [address]    Special Requests   Final    NONE Performed at [facility], [address] [place], [place] [address]    Culture   Final    NO GROWTH Performed at [facility], [place] [address], [place], [place] [address]    Report Status 03/10/2020 FINAL  Final  Blood culture (routine x 2)     Status: None (Preliminary result)   Collection Time: 03/08/20  2:44 PM   Specimen: Right Antecubital; Blood  Result Value Ref Range Status   Specimen Description RIGHT ANTECUBITAL  Final  Special Requests   Final    BOTTLES DRAWN AEROBIC AND ANAEROBIC Blood Culture adequate volume   Culture   Final    NO GROWTH 2 DAYS Performed at [facility], [address], [place], [place] [address]    Report Status PENDING  Incomplete  Blood culture (routine x 2)     Status: None (Preliminary result)   Collection Time: 03/08/20  2:44 PM   Specimen: BLOOD RIGHT HAND  Result Value Ref Range Status   Specimen Description BLOOD RIGHT HAND  Final   Special Requests   Final    BOTTLES DRAWN AEROBIC AND ANAEROBIC Blood Culture adequate volume   Culture   Final    NO GROWTH 2 DAYS Performed at [facility], [address] [place], [place] [address]    Report Status PENDING  Incomplete  SARS Coronavirus 2 by RT PCR (hospital order, performed in [place] hospital lab) Nasopharyngeal Nasopharyngeal Swab     Status: None   Collection Time: 03/08/20  3:30 PM   Specimen: Nasopharyngeal Swab  Result Value Ref Range Status   SARS Coronavirus 2 NEGATIVE NEGATIVE Final    Comment: (NOTE) SARS-CoV-2 target nucleic acids are NOT DETECTED. The  SARS-CoV-2 RNA is generally detectable in upper and lower respiratory specimens during the acute phase of infection. The lowest concentration of SARS-CoV-2 viral copies this assay can detect is 250 copies / mL. A negative result does not preclude SARS-CoV-2 infection and should not be used as the sole basis for treatment or other patient management decisions.  A negative result may occur with improper specimen collection / handling, submission of specimen other than nasopharyngeal swab, presence of viral mutation(s) within the areas targeted by this assay, and inadequate number of viral copies (<250 copies / mL). A negative result must be combined with clinical observations, patient history, and epidemiological information. Fact Sheet for Patients:   StrictlyIdeas.no Fact Sheet for Healthcare Providers: BankingDealers.co.za This test is not yet approved or cleared  by the [place] FDA and has been authorized for detection and/or diagnosis of SARS-CoV-2 by FDA under an Emergency Use Authorization (EUA).  This EUA will remain in effect (meaning this test can be used) for the duration of the COVID-19 declaration under Section 564(b)(1) of the Act, 21 U.S.C. section 360bbb-3(b)(1), unless the authorization is terminated or revoked sooner. Performed at [facility], [address] [place], [place] [address]          Radiology Studies: CT ABDOMEN PELVIS W CONTRAST  Result Date: 03/08/2020 CLINICAL DATA:  Abdominal pain. Evaluate for bowel obstruction. EXAM: CT ABDOMEN AND PELVIS WITH CONTRAST TECHNIQUE: Multidetector CT imaging of the abdomen and pelvis was performed using the standard protocol following bolus administration of intravenous contrast. CONTRAST:  59mL OMNIPAQUE IOHEXOL 300 MG/ML  SOLN COMPARISON:  None. FINDINGS: Lower chest: Calcification identified within the LAD coronary artery. The heart size appears within normal limits.  No pericardial effusion. Small right pleural effusion. Imaged portions of the lungs are otherwise clear. Hepatobiliary: The liver is enlarged. There are innumerable ill-defined, peripherally enhancing lesions throughout the liver compatible with metastatic disease. Lesions are too numerous to count including: Index segment 4 lesion measures 7.7 x 6.4 cm, image 26/2. Index lesion within segment 8 measures 7.4 x 8.4 cm. Index lesion within segment 2 measures 4.3 by 3.4 cm, image 25/2. Evidence of intrahepatic biliary obstruction is noted with dilatation of the intrahepatic bile ducts and normal common bile duct, image 23/2. Gallbladder negative. Pancreas: Unremarkable. No  pancreatic ductal dilatation or surrounding inflammatory changes. Spleen: Normal in size without focal abnormality. Adrenals/Urinary Tract: Normal adrenal glands. No kidney mass or hydronephrosis identified bilaterally. Bladder unremarkable. Stomach/Bowel: Stomach is nondistended. No bowel wall thickening, no small bowel wall thickening, inflammation or distension. Circumferential mass involving the hepatic flexure is identified this, image 48/2 and image 53/6. This involves a approximate 5.8 cm segment of colon, image 48/6. No obstruction. Vascular/Lymphatic: Aortic atherosclerosis without aneurysm. Enlarged ileocolic lymph node measures 2.2 x 2.3 cm. Gastrohepatic ligament lymph node measures 1.3 cm short axis, image 31/2. Prominent but non pathologically enlarged external iliac lymph nodes are identified including 9 mm left external iliac node, image 75/2. Reproductive: The uterus is enlarged and contains 2 high attenuation masses which are favored to represent fibroids. The largest measures 4 cm, image 69/7. No adnexal mass identified. Other: Moderate volume of ascites is noted within the abdomen and pelvis. Although no definite peritoneal nodule or mass identified, peritoneal carcinomatosis cannot be excluded. Diffuse body wall edema is  identified compatible with anasarca. Musculoskeletal: No acute or suspicious bone lesions identified. IMPRESSION: 1. Nonobstructing circumferential mass involving the hepatic flexure is identified concerning for colon cancer. Further investigation with colonoscopy is advised. 2. Innumerable liver metastases are noted. Additionally, there are enlarged ileocolic and gastrohepatic ligament lymph node concerning for metastatic adenopathy. 3. Moderate volume of ascites noted within the abdomen and pelvis. Although no definite peritoneal nodule or mass identified, peritoneal carcinomatosis cannot be excluded. Consider further evaluation with diagnostic paracentesis. 4. Small right pleural effusion. 5. Aortic atherosclerosis. LAD coronary artery calcifications noted. 6. Enlarged uterus containing 2 high attenuation masses which are favored to represent fibroids. Aortic Atherosclerosis (ICD10-I70.0). Electronically Signed   By: Kerby Moors M.D.   On: 03/08/2020 15:14        Scheduled Meds: . bisacodyl  10 mg Oral Once  . pantoprazole  40 mg Oral BID  . Vitamin D (Ergocalciferol)  50,000 Units Oral Q7 days   Continuous Infusions: . sodium chloride 100 mL/hr at 03/10/20 0429  . cefTRIAXone (ROCEPHIN)  IV 2 g (03/09/20 1742)     LOS: 2 days   Time spent= 35 mins    Tobey Schmelzle Arsenio Loader, MD [facility]  If 7PM-7AM, please contact night-coverage  03/10/2020, 11:12 AM

## 2020-03-10 NOTE — Discharge Summary (Signed)
Physician Discharge Summary  Natalie Washington [id number] DOB: 1966/08/13 DOA: 03/08/2020  PCP: Patient, No Pcp Per  Admit date: 03/08/2020 Discharge date: 03/10/2020  Admitted From: Home Disposition: Home  Recommendations for Outpatient Follow-up:  1. Follow up with PCP in 1-2 weeks 2. Please obtain BMP/CBC in one week your next doctors visit.  3. Patient needs to follow-up outpatient oncology in 1 week.  Information has been provided. 4. Patient does not want to wait for CT chest results, biopsy results or port placement. 5. Pain medications with bowel regimen has been prescribed 6. Vitamin D 50,000 units weekly for 7 more dose.  Thereafter can be on daily supplements 7. Oral Keflex for 4 days   Discharge Condition: Stable CODE STATUS: Full code Diet recommendation: Soft  Brief/Interim Summary: 54 year old with history of alcohol use, tobacco use recent hospitalization for profound anemia secondary to GI bleed when she refused endoscopic and colonoscopy evaluation.  After going home started having rectal bleeding therefore came back to the hospital.  CT abdomen pelvis showed nonobstructive colonic mass with metastatic disease to liver.  GI was consulted.  Colonoscopy on 5/13 showed nearly obstructive colon mass biopsies taken.  Oncology, general surgery and palliative care surgery consulted.  Patient was seen by general surgery and oncology who recommended outpatient follow-up within 1 week after biopsy results are available.  Palliative care service not available until next week and patient does not want to wait in the hospital for this.  CT chest of contrast has been done but results are not available at the time of her discharge.  Patient would like to go home at this time does not want to wait for physical therapy, CT chest or any other work-up.  Spoke with patient's mother and patient, we will discharge them home with outpatient follow-up.   Assessment & Plan:   Active  Problems:   Anemia   Colonic mass   Nearly obstructing colon mass with liver mets/abdominal distention Moderate volume ascites Concerning colonic mass status post biopsy.  Nearly obstructing.  Status post colonoscopy 5/13 biopsy sent. CT chest for staging has been done-results pending Overall poor prognosis.  Palliative care not available until Monday patient does not want to wait for this. Seen by oncology-outpatient follow-up next week.  Unable to place Chemo-Port prior to discharge as patient would not like to wait for this.  This can be arranged outpatient once biopsy results are available and once further plan for chemo is definitively planned.  Patient would like to go home at this time and follow-up outpatient with oncology.  Severe hypokalemia hypocalcemia Vitamin D deficiency Vitamin D supplements ordered.  50,000 units weekly.  7 more doses have been prescribed.  Thereafter he can be on routine supplements  Hypophosphatemia -Repleted.  Elevated LFTs/hypoalbuminemia. Suspect from metastatic disease.  We will continue to monitor this for now.  Urinary tract infection, POA Urine cultures-no growth currently on empiric IV Rocephin.  Further culture data pending.  Will empirically send her home on 4 more days of Keflex  Anemia of chronic blood loss. Hemoglobin is 8.0.  Status post 1 unit PRBC transfusion.Marland Kitchen  History of alcohol abuse Reports abstinence since last hospitalization March of this year      Discharge Diagnoses:  Active Problems:   Anemia   Colonic mass   Hypokalemia    Consultations:  General surgery  Oncology  Subjective: Spoke with the patient right after her CT chest and her mother over the phone.  They would like to be  discharged at this time and does not want to look for any further evaluation.  They will follow-up outpatient as recommended above.  Discharge Exam: Vitals:   03/09/20 2243 03/10/20 0602  BP: 106/83 109/75  Pulse:  (!) 109 (!) 101  Resp: 17 16  Temp: 98 F (36.7 C) 99 F (37.2 C)  SpO2: 99% 98%   Vitals:   03/09/20 1630 03/09/20 1700 03/09/20 2243 03/10/20 0602  BP: 118/87 109/89 106/83 109/75  Pulse: (!) 104 95 (!) 109 (!) 101  Resp: (!) 25 (!) 22 17 16   Temp:  (!) 97.4 F (36.3 C) 98 F (36.7 C) 99 F (37.2 C)  TempSrc:  Oral Oral Oral  SpO2: 98% 95% 99% 98%  Weight:      Height:  5\' 1"  (1.549 m)       Discharge Instructions  Discharge Instructions    Discharge patient   Complete by: As directed    Discharge disposition: 01-Home or Self Care   Discharge patient date: 03/10/2020     Allergies as of 03/10/2020   No Known Allergies     Medication List    TAKE these medications   oxyCODONE 5 MG immediate release tablet Commonly known as: Oxy IR/ROXICODONE Take 1 tablet (5 mg total) by mouth every 6 (six) hours as needed for moderate pain or severe pain.   pantoprazole 40 MG tablet Commonly known as: Protonix Take 1 tablet (40 mg total) by mouth 2 (two) times daily.   polyethylene glycol 17 g packet Commonly known as: MIRALAX / GLYCOLAX Take 17 g by mouth daily as needed for moderate constipation or severe constipation.   senna-docusate 8.6-50 MG tablet Commonly known as: Senokot-S Take 2 tablets by mouth at bedtime as needed for mild constipation or moderate constipation.   Vitamin D (Ergocalciferol) 1.25 MG (50000 UNIT) Caps capsule Commonly known as: DRISDOL Take 1 capsule (50,000 Units total) by mouth every 7 (seven) days for 7 doses. Start taking on: Mar 17, 2020      Follow-up Information    Derek Jack, MD. Schedule an appointment as soon as possible for a visit in 1 week(s).   Specialty: Hematology Contact information: [place] [address] [phone]        Virl Cagey, MD Follow up.   Specialty: General Surgery Contact information: [address] [facility] [address] [phone]          No Known  Allergies  You were cared for by a hospitalist during your hospital stay. If you have any questions about your discharge medications or the care you received while you were in the hospital after you are discharged, you can call the unit and asked to speak with the hospitalist on call if the hospitalist that took care of you is not available. Once you are discharged, your primary care physician will handle any further medical issues. Please note that no refills for any discharge medications will be authorized once you are discharged, as it is imperative that you return to your primary care physician (or establish a relationship with a primary care physician if you do not have one) for your aftercare needs so that they can reassess your need for medications and monitor your lab values.   Procedures/Studies: CT ABDOMEN PELVIS W CONTRAST  Result Date: 03/08/2020 CLINICAL DATA:  Abdominal pain. Evaluate for bowel obstruction. EXAM: CT ABDOMEN AND PELVIS WITH CONTRAST TECHNIQUE: Multidetector CT imaging of the abdomen and pelvis was performed using the  standard protocol following bolus administration of intravenous contrast. CONTRAST:  17mL OMNIPAQUE IOHEXOL 300 MG/ML  SOLN COMPARISON:  None. FINDINGS: Lower chest: Calcification identified within the LAD coronary artery. The heart size appears within normal limits. No pericardial effusion. Small right pleural effusion. Imaged portions of the lungs are otherwise clear. Hepatobiliary: The liver is enlarged. There are innumerable ill-defined, peripherally enhancing lesions throughout the liver compatible with metastatic disease. Lesions are too numerous to count including: Index segment 4 lesion measures 7.7 x 6.4 cm, image 26/2. Index lesion within segment 8 measures 7.4 x 8.4 cm. Index lesion within segment 2 measures 4.3 by 3.4 cm, image 25/2. Evidence of intrahepatic biliary obstruction is noted with dilatation of the intrahepatic bile ducts and normal common  bile duct, image 23/2. Gallbladder negative. Pancreas: Unremarkable. No pancreatic ductal dilatation or surrounding inflammatory changes. Spleen: Normal in size without focal abnormality. Adrenals/Urinary Tract: Normal adrenal glands. No kidney mass or hydronephrosis identified bilaterally. Bladder unremarkable. Stomach/Bowel: Stomach is nondistended. No bowel wall thickening, no small bowel wall thickening, inflammation or distension. Circumferential mass involving the hepatic flexure is identified this, image 48/2 and image 53/6. This involves a approximate 5.8 cm segment of colon, image 48/6. No obstruction. Vascular/Lymphatic: Aortic atherosclerosis without aneurysm. Enlarged ileocolic lymph node measures 2.2 x 2.3 cm. Gastrohepatic ligament lymph node measures 1.3 cm short axis, image 31/2. Prominent but non pathologically enlarged external iliac lymph nodes are identified including 9 mm left external iliac node, image 75/2. Reproductive: The uterus is enlarged and contains 2 high attenuation masses which are favored to represent fibroids. The largest measures 4 cm, image 69/7. No adnexal mass identified. Other: Moderate volume of ascites is noted within the abdomen and pelvis. Although no definite peritoneal nodule or mass identified, peritoneal carcinomatosis cannot be excluded. Diffuse body wall edema is identified compatible with anasarca. Musculoskeletal: No acute or suspicious bone lesions identified. IMPRESSION: 1. Nonobstructing circumferential mass involving the hepatic flexure is identified concerning for colon cancer. Further investigation with colonoscopy is advised. 2. Innumerable liver metastases are noted. Additionally, there are enlarged ileocolic and gastrohepatic ligament lymph node concerning for metastatic adenopathy. 3. Moderate volume of ascites noted within the abdomen and pelvis. Although no definite peritoneal nodule or mass identified, peritoneal carcinomatosis cannot be excluded.  Consider further evaluation with diagnostic paracentesis. 4. Small right pleural effusion. 5. Aortic atherosclerosis. LAD coronary artery calcifications noted. 6. Enlarged uterus containing 2 high attenuation masses which are favored to represent fibroids. Aortic Atherosclerosis (ICD10-I70.0). Electronically Signed   By: Kerby Moors M.D.   On: 03/08/2020 15:14      The results of significant diagnostics from this hospitalization (including imaging, microbiology, ancillary and laboratory) are listed below for reference.     Microbiology: Recent Results (from the past 240 hour(s))  Culture, Urine     Status: None   Collection Time: 03/08/20 11:26 AM   Specimen: Urine, Random  Result Value Ref Range Status   Specimen Description   Final    URINE, RANDOM Performed at [facility], [address] [place], [place] [address]    Special Requests   Final    NONE Performed at [facility], [address] [place], [place] [address]    Culture   Final    NO GROWTH Performed at [facility], [place] [address] [place], [place] [address]    Report Status 03/10/2020 FINAL  Final  Blood culture (routine x 2)     Status: None (Preliminary result)   Collection  Time: 03/08/20  2:44 PM   Specimen: Right Antecubital; Blood  Result Value Ref Range Status   Specimen Description RIGHT ANTECUBITAL  Final   Special Requests   Final    BOTTLES DRAWN AEROBIC AND ANAEROBIC Blood Culture adequate volume   Culture   Final    NO GROWTH 2 DAYS Performed at [facility], [address], [place], [place] [address]    Report Status PENDING  Incomplete  Blood culture (routine x 2)     Status: None (Preliminary result)   Collection Time: 03/08/20  2:44 PM   Specimen: BLOOD RIGHT HAND  Result Value Ref Range Status   Specimen Description BLOOD RIGHT HAND  Final   Special Requests   Final    BOTTLES DRAWN AEROBIC AND ANAEROBIC Blood Culture adequate volume   Culture   Final    NO GROWTH 2 DAYS Performed  at [facility], [address] [place], [place] [address]    Report Status PENDING  Incomplete  SARS Coronavirus 2 by RT PCR (hospital order, performed in [place] hospital lab) Nasopharyngeal Nasopharyngeal Swab     Status: None   Collection Time: 03/08/20  3:30 PM   Specimen: Nasopharyngeal Swab  Result Value Ref Range Status   SARS Coronavirus 2 NEGATIVE NEGATIVE Final    Comment: (NOTE) SARS-CoV-2 target nucleic acids are NOT DETECTED. The SARS-CoV-2 RNA is generally detectable in upper and lower respiratory specimens during the acute phase of infection. The lowest concentration of SARS-CoV-2 viral copies this assay can detect is 250 copies / mL. A negative result does not preclude SARS-CoV-2 infection and should not be used as the sole basis for treatment or other patient management decisions.  A negative result may occur with improper specimen collection / handling, submission of specimen other than nasopharyngeal swab, presence of viral mutation(s) within the areas targeted by this assay, and inadequate number of viral copies (<250 copies / mL). A negative result must be combined with clinical observations, patient history, and epidemiological information. Fact Sheet for Patients:   StrictlyIdeas.no Fact Sheet for Healthcare Providers: BankingDealers.co.za This test is not yet approved or cleared  by the [place] FDA and has been authorized for detection and/or diagnosis of SARS-CoV-2 by FDA under an Emergency Use Authorization (EUA).  This EUA will remain in effect (meaning this test can be used) for the duration of the COVID-19 declaration under Section 564(b)(1) of the Act, 21 U.S.C. section 360bbb-3(b)(1), unless the authorization is terminated or revoked sooner. Performed at [facility], [address] [place], [place] [address]      Labs: BNP (last 3 results) No results for input(s): BNP in the last 8760  hours. Basic Metabolic Panel: Recent Labs  Lab 03/08/20 1144 03/09/20 0010 03/09/20 0348 03/10/20 0413  NA 136 133* 135 136  K 2.6* 3.8 3.6 3.5  CL 94* 100 101 101  CO2 27 23 24 24   GLUCOSE 109* 115* 97 65*  BUN 8 7 7 6   CREATININE 0.41* 0.34* 0.32* 0.35*  CALCIUM 7.9* 7.0* 7.1* 7.5*  MG 2.4  --  2.0 2.0  PHOS 2.1*  --   --   --    Liver Function Tests: Recent Labs  Lab 03/08/20 1144 03/09/20 0348 03/10/20 0413  AST 61* 54* 56*  ALT 17 15 15   ALKPHOS 380* 283* 273*  BILITOT 1.2 1.5* 1.6*  PROT 6.3* 5.0* 5.0*  ALBUMIN 2.0* 1.7* 1.6*   Recent Labs  Lab 03/08/20 1144  LIPASE 16  No results for input(s): AMMONIA in the last 168 hours. CBC: Recent Labs  Lab 03/08/20 1144 03/09/20 0348 03/10/20 0413  WBC 11.8* 12.2* 14.0*  NEUTROABS 9.0*  --   --   HGB 7.8* 7.7* 8.0*  HCT 26.8* 25.5* 26.6*  MCV 83.8 83.9 84.2  PLT 384 339 389   Cardiac Enzymes: No results for input(s): CKTOTAL, CKMB, CKMBINDEX, TROPONINI in the last 168 hours. BNP: Invalid input(s): POCBNP CBG: No results for input(s): GLUCAP in the last 168 hours. D-Dimer No results for input(s): DDIMER in the last 72 hours. Hgb A1c No results for input(s): HGBA1C in the last 72 hours. Lipid Profile No results for input(s): CHOL, HDL, LDLCALC, TRIG, CHOLHDL, LDLDIRECT in the last 72 hours. Thyroid function studies No results for input(s): TSH, T4TOTAL, T3FREE, THYROIDAB in the last 72 hours.  Invalid input(s): FREET3 Anemia work up No results for input(s): VITAMINB12, FOLATE, FERRITIN, TIBC, IRON, RETICCTPCT in the last 72 hours. Urinalysis    Component Value Date/Time   COLORURINE AMBER (A) 03/08/2020 1126   APPEARANCEUR CLOUDY (A) 03/08/2020 1126   LABSPEC 1.024 03/08/2020 1126   PHURINE 5.0 03/08/2020 1126   GLUCOSEU NEGATIVE 03/08/2020 1126   [place] 03/08/2020 1126   BILIRUBINUR SMALL (A) 03/08/2020 1126   [place] 03/08/2020 1126   PROTEINUR 30 (A) 03/08/2020 1126    NITRITE NEGATIVE 03/08/2020 1126   LEUKOCYTESUR MODERATE (A) 03/08/2020 1126   Sepsis Labs Invalid input(s): PROCALCITONIN,  WBC,  LACTICIDVEN Microbiology Recent Results (from the past 240 hour(s))  Culture, Urine     Status: None   Collection Time: 03/08/20 11:26 AM   Specimen: Urine, Random  Result Value Ref Range Status   Specimen Description   Final    URINE, RANDOM Performed at [facility], [address], [place], [place] [address]    Special Requests   Final    NONE Performed at [facility], [address] [place], [place] [address]    Culture   Final    NO GROWTH Performed at [facility], [place] [address], [place], [place] [address]    Report Status 03/10/2020 FINAL  Final  Blood culture (routine x 2)     Status: None (Preliminary result)   Collection Time: 03/08/20  2:44 PM   Specimen: Right Antecubital; Blood  Result Value Ref Range Status   Specimen Description RIGHT ANTECUBITAL  Final   Special Requests   Final    BOTTLES DRAWN AEROBIC AND ANAEROBIC Blood Culture adequate volume   Culture   Final    NO GROWTH 2 DAYS Performed at [facility], [address] [place], [place] [address]    Report Status PENDING  Incomplete  Blood culture (routine x 2)     Status: None (Preliminary result)   Collection Time: 03/08/20  2:44 PM   Specimen: BLOOD RIGHT HAND  Result Value Ref Range Status   Specimen Description BLOOD RIGHT HAND  Final   Special Requests   Final    BOTTLES DRAWN AEROBIC AND ANAEROBIC Blood Culture adequate volume   Culture   Final    NO GROWTH 2 DAYS Performed at [facility], [address], [place], [place] [address]    Report Status PENDING  Incomplete  SARS Coronavirus 2 by RT PCR (hospital order, performed in [place] hospital lab) Nasopharyngeal Nasopharyngeal Swab     Status: None   Collection Time: 03/08/20  3:30 PM   Specimen: Nasopharyngeal Swab  Result Value Ref Range Status  SARS Coronavirus 2 NEGATIVE NEGATIVE Final     Comment: (NOTE) SARS-CoV-2 target nucleic acids are NOT DETECTED. The SARS-CoV-2 RNA is generally detectable in upper and lower respiratory specimens during the acute phase of infection. The lowest concentration of SARS-CoV-2 viral copies this assay can detect is 250 copies / mL. A negative result does not preclude SARS-CoV-2 infection and should not be used as the sole basis for treatment or other patient management decisions.  A negative result may occur with improper specimen collection / handling, submission of specimen other than nasopharyngeal swab, presence of viral mutation(s) within the areas targeted by this assay, and inadequate number of viral copies (<250 copies / mL). A negative result must be combined with clinical observations, patient history, and epidemiological information. Fact Sheet for Patients:   StrictlyIdeas.no Fact Sheet for Healthcare Providers: BankingDealers.co.za This test is not yet approved or cleared  by the [place] FDA and has been authorized for detection and/or diagnosis of SARS-CoV-2 by FDA under an Emergency Use Authorization (EUA).  This EUA will remain in effect (meaning this test can be used) for the duration of the COVID-19 declaration under Section 564(b)(1) of the Act, 21 U.S.C. section 360bbb-3(b)(1), unless the authorization is terminated or revoked sooner. Performed at [facility], [address], [place],  [address]      Time coordinating discharge:  I have spent 35 minutes face to face with the patient and on the ward discussing the patients care, assessment, plan and disposition with other care givers. >50% of the time was devoted counseling the patient about the risks and benefits of treatment/Discharge disposition and coordinating care.   SIGNED:   Damita Lack, MD  [facility] 03/10/2020, 4:30 PM   If 7PM-7AM, please contact night-coverage

## 2020-03-10 NOTE — Progress Notes (Signed)
Pt discharged via wheelchair to POV accompanied by staff. All pt's personal belongings in her possession at discharge.

## 2020-03-10 NOTE — Progress Notes (Signed)
Subjective: Feeling ok today. Abdomen feels less swollen, better this morning. Is aware of her diagnosis and plans to follow pathology, consult oncology and palliative care for input. Denies abdominal pain, N/V. Tolerating clear liquids today. No further bowel movement since prep yesterday. Denies any other overt GI complaints.  Objective: Vital signs in last 24 hours: Temp:  [97.4 F (36.3 C)-99 F (37.2 C)] 99 F (37.2 C) (05/14 0602) Pulse Rate:  [86-109] 101 (05/14 0602) Resp:  [16-32] 16 (05/14 0602) BP: (91-146)/(59-89) 109/75 (05/14 0602) SpO2:  [94 %-100 %] 98 % (05/14 0602) Last BM Date: 03/09/20 General:   Alert and oriented, pleasant Head:  Normocephalic and atraumatic. Eyes:  No icterus, sclera clear. Conjuctiva pink.  Heart:  S1, S2 present, no murmurs noted.  Lungs: Clear to auscultation bilaterally, without wheezing, rales, or rhonchi.  Abdomen:  Bowel sounds present, soft, non-tender, non-distended. No HSM or hernias noted. No rebound or guarding. No masses appreciated  Msk:  Symmetrical without gross deformities. Pulses:  Normal bilateral DP pulses noted. Extremities:  Without clubbing or edema. Neurologic:  Alert and  oriented x4;  grossly normal neurologically. Psych:  Alert and cooperative. Normal mood and affect.  Intake/Output from previous day: 05/13 0701 - 05/14 0700 In: 7741 [P.O.:100; I.V.:1248.7; IV Piggyback:196.2] Out: 0  Intake/Output this shift: No intake/output data recorded.  Lab Results: Recent Labs    03/08/20 1144 03/09/20 0348 03/10/20 0413  WBC 11.8* 12.2* 14.0*  HGB 7.8* 7.7* 8.0*  HCT 26.8* 25.5* 26.6*  PLT 384 339 389   BMET Recent Labs    03/09/20 0010 03/09/20 0348 03/10/20 0413  NA 133* 135 136  K 3.8 3.6 3.5  CL 100 101 101  CO2 _0 GLUCOSE 115* 97 65*  BUN _1 CREATININE 0.34* 0.32* 0.35*  CALCIUM 7.0* 7.1* 7.5*   LFT Recent Labs    03/08/20 1144 03/09/20 0348 03/10/20 0413  PROT 6.3* 5.0*  5.0*  ALBUMIN 2.0* 1.7* 1.6*  AST 61* 54* 56*  ALT _2 ALKPHOS 380* 283* 273*  BILITOT 1.2 1.5* 1.6*   PT/INR Recent Labs    03/08/20 1615  LABPROT 14.1  INR 1.1   Hepatitis Panel No results for input(s): HEPBSAG, HCVAB, HEPAIGM, HEPBIGM in the last 72 hours.   Studies/Results: CT ABDOMEN PELVIS W CONTRAST  Result Date: 03/08/2020 CLINICAL DATA:  Abdominal pain. Evaluate for bowel obstruction. EXAM: CT ABDOMEN AND PELVIS WITH CONTRAST TECHNIQUE: Multidetector CT imaging of the abdomen and pelvis was performed using the standard protocol following bolus administration of intravenous contrast. CONTRAST:  89m OMNIPAQUE IOHEXOL 300 MG/ML  SOLN COMPARISON:  None. FINDINGS: Lower chest: Calcification identified within the LAD coronary artery. The heart size appears within normal limits. No pericardial effusion. Small right pleural effusion. Imaged portions of the lungs are otherwise clear. Hepatobiliary: The liver is enlarged. There are innumerable ill-defined, peripherally enhancing lesions throughout the liver compatible with metastatic disease. Lesions are too numerous to count including: Index segment 4 lesion measures 7.7 x 6.4 cm, image 26/2. Index lesion within segment 8 measures 7.4 x 8.4 cm. Index lesion within segment 2 measures 4.3 by 3.4 cm, image 25/2. Evidence of intrahepatic biliary obstruction is noted with dilatation of the intrahepatic bile ducts and normal common bile duct, image 23/2. Gallbladder negative. Pancreas: Unremarkable. No pancreatic ductal dilatation or surrounding inflammatory changes. Spleen: Normal in size without focal abnormality. Adrenals/Urinary Tract: Normal adrenal glands. No kidney mass or hydronephrosis  identified bilaterally. Bladder unremarkable. Stomach/Bowel: Stomach is nondistended. No bowel wall thickening, no small bowel wall thickening, inflammation or distension. Circumferential mass involving the hepatic flexure is identified this, image 48/2  and image 53/6. This involves a approximate 5.8 cm segment of colon, image 48/6. No obstruction. Vascular/Lymphatic: Aortic atherosclerosis without aneurysm. Enlarged ileocolic lymph node measures 2.2 x 2.3 cm. Gastrohepatic ligament lymph node measures 1.3 cm short axis, image 31/2. Prominent but non pathologically enlarged external iliac lymph nodes are identified including 9 mm left external iliac node, image 75/2. Reproductive: The uterus is enlarged and contains 2 high attenuation masses which are favored to represent fibroids. The largest measures 4 cm, image 69/7. No adnexal mass identified. Other: Moderate volume of ascites is noted within the abdomen and pelvis. Although no definite peritoneal nodule or mass identified, peritoneal carcinomatosis cannot be excluded. Diffuse body wall edema is identified compatible with anasarca. Musculoskeletal: No acute or suspicious bone lesions identified. IMPRESSION: 1. Nonobstructing circumferential mass involving the hepatic flexure is identified concerning for colon cancer. Further investigation with colonoscopy is advised. 2. Innumerable liver metastases are noted. Additionally, there are enlarged ileocolic and gastrohepatic ligament lymph node concerning for metastatic adenopathy. 3. Moderate volume of ascites noted within the abdomen and pelvis. Although no definite peritoneal nodule or mass identified, peritoneal carcinomatosis cannot be excluded. Consider further evaluation with diagnostic paracentesis. 4. Small right pleural effusion. 5. Aortic atherosclerosis. LAD coronary artery calcifications noted. 6. Enlarged uterus containing 2 high attenuation masses which are favored to represent fibroids. Aortic Atherosclerosis (ICD10-I70.0). Electronically Signed   By: Kerby Moors M.D.   On: 03/08/2020 15:14    Assessment: Pleasant 54 year old female with transfusion dependent anemia, heme positive stool, declined endoscopic evaluation during admission back in  March and was on the scheduled for EGD and colonoscopy next week.  Presented to the emergency department with abdominal distention, discomfort, lower extremity edema.  Hemoglobin on admission, 7.8 and after 1 unit of blood, hemoglobin 7.7.  CT with circumferential mass involving the hepatic flexure and innumerable liver metastasis, moderate volume ascites, enlarged ileocolic and gastrohepatic ligament lymph nodes concerning for metastatic colon cancer.  Abnormal LFTs likely related to liver metastasis with evidence of intrahepatic biliary obstruction.  Lower extremity edema in the setting of hypoalbuminemia.  Colon mass- Colonoscopy completed yesterday which found diverticulosis in the sigmoid colon and descending colon, single 5 mm polyp splenic flexure. Nearly obstructing exophytic, necrotic appearing mass at hepatic flexure/distal ascending segment s/p biopsy. Pathology pending. CEA massively elevated 1,818. Lactic acid elevated but improved 4.9 -> 3.4 yesterday. CMP with persistent LFT elevation.stable with AST 56, ALT 15, Alk phos 273, Bili 1.6. Hospitalist has entered palliative care consult, oncology. Planned CT chest for staging and per surgical request for data. Further recommendations pending biopsy results. Generally clinically improved today.  Anemia- Hgb on admission 7.8 s/p PRBC x 1 and hgb 7.7 after. Hgb today minimal improvement to 8.0 from 7.7 yesterday.     Plan: 1. Follow for pathology 2. Agree with palliative consult 3. Supportive care   Thank you for allowing [place] to participate in the care of [place], DNP, AGNP-C Adult & Gerontological Nurse Practitioner [facility]     LOS: 2 days    03/10/2020, 9:30 AM

## 2020-03-12 LAB — TYPE AND SCREEN
ABO/RH(D): AB POS
Antibody Screen: NEGATIVE
Unit division: 0
Unit division: 0

## 2020-03-12 LAB — BPAM RBC
Blood Product Expiration Date: 202105262359
Blood Product Expiration Date: 202105282359
ISSUE DATE / TIME: 202105122021
Unit Type and Rh: 6200
Unit Type and Rh: 6200

## 2020-03-13 ENCOUNTER — Other Ambulatory Visit (HOSPITAL_COMMUNITY): Admission: RE | Admit: 2020-03-13 | Payer: Self-pay | Source: Ambulatory Visit

## 2020-03-13 LAB — CULTURE, BLOOD (ROUTINE X 2)
Culture: NO GROWTH
Culture: NO GROWTH
Special Requests: ADEQUATE
Special Requests: ADEQUATE

## 2020-03-13 LAB — SURGICAL PATHOLOGY

## 2020-03-14 ENCOUNTER — Encounter (HOSPITAL_COMMUNITY): Payer: Self-pay

## 2020-03-14 ENCOUNTER — Ambulatory Visit (HOSPITAL_COMMUNITY): Admit: 2020-03-14 | Payer: Self-pay | Admitting: Internal Medicine

## 2020-03-14 SURGERY — COLONOSCOPY
Anesthesia: Moderate Sedation

## 2020-03-16 ENCOUNTER — Other Ambulatory Visit (HOSPITAL_COMMUNITY): Payer: Self-pay | Admitting: *Deleted

## 2020-03-16 ENCOUNTER — Other Ambulatory Visit: Payer: Self-pay

## 2020-03-16 ENCOUNTER — Inpatient Hospital Stay (HOSPITAL_COMMUNITY): Payer: Medicaid Other | Attending: Hematology | Admitting: Hematology

## 2020-03-16 DIAGNOSIS — D259 Leiomyoma of uterus, unspecified: Secondary | ICD-10-CM | POA: Insufficient documentation

## 2020-03-16 DIAGNOSIS — R599 Enlarged lymph nodes, unspecified: Secondary | ICD-10-CM | POA: Insufficient documentation

## 2020-03-16 DIAGNOSIS — K831 Obstruction of bile duct: Secondary | ICD-10-CM | POA: Diagnosis not present

## 2020-03-16 DIAGNOSIS — R160 Hepatomegaly, not elsewhere classified: Secondary | ICD-10-CM | POA: Diagnosis not present

## 2020-03-16 DIAGNOSIS — J90 Pleural effusion, not elsewhere classified: Secondary | ICD-10-CM | POA: Insufficient documentation

## 2020-03-16 DIAGNOSIS — R109 Unspecified abdominal pain: Secondary | ICD-10-CM | POA: Insufficient documentation

## 2020-03-16 DIAGNOSIS — Z79899 Other long term (current) drug therapy: Secondary | ICD-10-CM | POA: Diagnosis not present

## 2020-03-16 DIAGNOSIS — C787 Secondary malignant neoplasm of liver and intrahepatic bile duct: Secondary | ICD-10-CM | POA: Insufficient documentation

## 2020-03-16 DIAGNOSIS — I700 Atherosclerosis of aorta: Secondary | ICD-10-CM | POA: Insufficient documentation

## 2020-03-16 DIAGNOSIS — Z800 Family history of malignant neoplasm of digestive organs: Secondary | ICD-10-CM | POA: Diagnosis not present

## 2020-03-16 DIAGNOSIS — D649 Anemia, unspecified: Secondary | ICD-10-CM | POA: Diagnosis not present

## 2020-03-16 DIAGNOSIS — R5383 Other fatigue: Secondary | ICD-10-CM | POA: Diagnosis not present

## 2020-03-16 DIAGNOSIS — C183 Malignant neoplasm of hepatic flexure: Secondary | ICD-10-CM | POA: Insufficient documentation

## 2020-03-16 DIAGNOSIS — R188 Other ascites: Secondary | ICD-10-CM | POA: Diagnosis not present

## 2020-03-16 DIAGNOSIS — M7989 Other specified soft tissue disorders: Secondary | ICD-10-CM | POA: Diagnosis not present

## 2020-03-16 DIAGNOSIS — E8809 Other disorders of plasma-protein metabolism, not elsewhere classified: Secondary | ICD-10-CM | POA: Insufficient documentation

## 2020-03-16 DIAGNOSIS — M545 Low back pain: Secondary | ICD-10-CM | POA: Insufficient documentation

## 2020-03-16 DIAGNOSIS — F17210 Nicotine dependence, cigarettes, uncomplicated: Secondary | ICD-10-CM | POA: Insufficient documentation

## 2020-03-16 DIAGNOSIS — Z7189 Other specified counseling: Secondary | ICD-10-CM

## 2020-03-16 MED ORDER — FENTANYL 25 MCG/HR TD PT72: 1.0000 | MEDICATED_PATCH | TRANSDERMAL | 0 refills | Status: DC

## 2020-03-16 MED ORDER — OXYCODONE HCL 5 MG PO TABS: 5.0000 mg | ORAL_TABLET | Freq: Four times a day (QID) | ORAL | 0 refills | Status: DC | PRN

## 2020-03-16 NOTE — Assessment & Plan Note (Addendum)
1.  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 -Colonoscopy showed near obstructing exophytic necrotic mass appearing at the hepatic flexure/distal ascending segment.  Biopsies consistent with adenocarcinoma. -CEA was elevated at 1818. -CT of the chest did not show any metastatic disease.  Bilateral pleural effusions, right greater than left. -She does not have any obstructive symptoms at this time.  She was not felt to be a good candidate for diverting colostomy/ileostomy because of her ascites. -We discussed about palliative chemotherapy versus best supportive care in the form of hospice.  Patient would like to receive active therapy at this time.  We talked about prognosis of metastatic colon cancer. -She is currently living with her sister.  Sister is present today with her.  She needs port placement for chemotherapy administration. -We will send her tumor for NGS testing.  We will also check MSI status. -We will consider FOLFOX plus bevacizumab.  We will see her back after port placement.  2.  Low back pain: -Patient reports low back pain which goes across her lower back. -No evidence of musculoskeletal meta stasis based on CT scan.  Patient reports that she had pain even prior to hospitalization. -She is currently taking oxycodone 5 mg 4 to 5 tablets daily.  Its not controlling it well. -I will start her on fentanyl 25 mcg patch.  She will continue taking oxycodone 5 mg 1 to 2 tablets as needed for breakthrough pain.  I have recommended stool softeners.  3.  Normocytic anemia: -Last hemoglobin was 8.0 at the time of discharge.  MCV was 84.  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 -We will consider parenteral iron  therapy with Feraheme x2.  4.  Ascites: -She has hypoalbuminemia with ascites.  She also has 2+ edema bilaterally. -Her blood pressure is borderline systolic at 95/84.  I am reluctant to consider Lasix at this time.

## 2020-03-16 NOTE — Progress Notes (Signed)
Natalie [place], [place] [address]   CLINIC:  Medical Oncology/Hematology  PCP:  Patient, No Pcp Per None None   REASON FOR VISIT:  Follow-up for colon cancer metastatic to the liver  PRIOR THERAPY: none  CURRENT THERAPY: To begin chemotherapy  BRIEF ONCOLOGIC HISTORY:  Oncology History  Malignant neoplasm of hepatic flexure ([facility])  03/10/2020 Initial Diagnosis   Malignant neoplasm of hepatic flexure ([place])   03/16/2020 Cancer Staging   Staging form: Colon and Rectum, AJCC 8th Edition - Clinical stage from 03/16/2020: Stage IVC (cTX, cNX, cM1c) - Signed by Derek Jack, MD on 03/16/2020     CANCER STAGING: Cancer Staging Malignant neoplasm of hepatic flexure [facility]) Staging form: Colon and Rectum, AJCC 8th Edition - Clinical stage from 03/16/2020: Stage IVC (cTX, cNX, cM1c) - Signed by Derek Jack, MD on 03/16/2020   INTERVAL HISTORY:  Natalie [place] 54 y.o. female returns for routine follow-up for her colon cancer metastatic to the liver. Natalie [place] was last seen in the hospital on 03/10/2020. She is here today with her sister.   Overall, she tells me she has been feeling pretty well. She is currently living with her sister. She has a lot of back pain and it is keeping her up at night. She has oxycodone 5 mg to treat for pain. This is not helping her sleep however, and she is having to double it down to 10 mg of oxycodone. She will take 4-6 5 mg tablets total in one day. She has also tried lidocaine patches, heating pads, rising beds, and a different recliner. She declines drowsiness from the oxycodone.    REVIEW OF SYSTEMS:  Review of Systems  Constitutional: Positive for appetite change (severely decreased) and fatigue (moderate). Negative for chills, diaphoresis and fever.  HENT:   Negative for mouth sores, sore throat and trouble swallowing.   Eyes: Negative for eye problems.  Respiratory: Negative for cough, shortness of breath  and wheezing.   Cardiovascular: Positive for leg swelling. Negative for chest pain and palpitations.  Gastrointestinal: Negative for abdominal pain, constipation, diarrhea, nausea and vomiting.  Genitourinary: Negative for bladder incontinence, dysuria and frequency.   Musculoskeletal: Positive for back pain (10/10). Negative for arthralgias and myalgias.  Skin: Positive for itching. Negative for rash.  Neurological: Negative for dizziness, extremity weakness, headaches and numbness.  Hematological: Does not bruise/bleed easily.  Psychiatric/Behavioral: Positive for sleep disturbance (due to pain). Negative for depression. The patient is not nervous/anxious.     PAST MEDICAL/SURGICAL HISTORY:  Past Medical History:  Diagnosis Date  . Acid reflux   . Pneumonia    1980   Past Surgical History:  Procedure Laterality Date  . BIOPSY  03/09/2020   Procedure: BIOPSY;  Surgeon: Daneil Dolin, MD;  Location: [facility];  Service: Endoscopy;;  . COLONOSCOPY WITH PROPOFOL N/A 03/09/2020   Procedure: COLONOSCOPY WITH PROPOFOL;  Surgeon: Daneil Dolin, MD;  Location: [facility];  Service: Endoscopy;  Laterality: N/A;  . ORIF HUMERUS FRACTURE Right 04/07/2019   Procedure: OPEN REDUCTION INTERNAL FIXATION (ORIF) PROXIMAL HUMERUS FRACTURE; BICEP TENODESIS;  Surgeon: Hiram Gash, MD;  Location: [facility];  Service: Orthopedics;  Laterality: Right;  . POLYPECTOMY  03/09/2020   Procedure: POLYPECTOMY;  Surgeon: Daneil Dolin, MD;  Location: [facility];  Service: Endoscopy;;  . WISDOM TOOTH EXTRACTION      SOCIAL HISTORY:  Social History   Socioeconomic History  . Marital status: Single  Spouse name: Not on file  . Number of children: Not on file  . Years of education: Not on file  . Highest education level: Not on file  Occupational History  . Not on file  Tobacco Use  . Smoking status: Current Every Day Smoker    Packs/day: 0.25    Years: 15.00    Pack years: 3.75  .  Smokeless tobacco: Never Used  Substance and Sexual Activity  . Alcohol use: Yes    Comment: once a week  . Drug use: Never  . Sexual activity: Not on file  Other Topics Concern  . Not on file  Social History Narrative  . Not on file   Social Determinants of Health   Financial Resource Strain:   . Difficulty of Paying Living Expenses:   Food Insecurity:   . Worried About Charity fundraiser in the Last Year:   . Arboriculturist in the Last Year:   Transportation Needs:   . Film/video editor (Medical):   Marland Kitchen Lack of Transportation (Non-Medical):   Physical Activity:   . Days of Exercise per Week:   . Minutes of Exercise per Session:   Stress:   . Feeling of Stress :   Social Connections:   . Frequency of Communication with Friends and Family:   . Frequency of Social Gatherings with Friends and Family:   . Attends Religious Services:   . Active Member of Clubs or Organizations:   . Attends Archivist Meetings:   Marland Kitchen Marital Status:   Intimate Partner Violence:   . Fear of Current or Ex-Partner:   . Emotionally Abused:   Marland Kitchen Physically Abused:   . Sexually Abused:     FAMILY HISTORY:  Family History  Problem Relation Age of Onset  . Colon cancer Father        unsure age of onset. Passed away in 01/04/05    CURRENT MEDICATIONS:  Current Outpatient Medications  Medication Sig Dispense Refill  . cephALEXin (KEFLEX) 500 MG capsule Take 500 mg by mouth 4 (four) times daily.    . pantoprazole (PROTONIX) 40 MG tablet Take 1 tablet (40 mg total) by mouth 2 (two) times daily. 120 tablet 0  . [START ON 03/17/2020] Vitamin D, Ergocalciferol, (DRISDOL) 1.25 MG (50000 UNIT) CAPS capsule Take 1 capsule (50,000 Units total) by mouth every 7 (seven) days for 7 doses. 7 capsule 0  . fentaNYL (DURAGESIC) 25 MCG/HR Place 1 patch onto the skin every 3 (three) days. 10 patch 0  . oxyCODONE (OXY IR/ROXICODONE) 5 MG immediate release tablet Take 1 tablet (5 mg total) by mouth every 6  (six) hours as needed for moderate pain or severe pain. 20 tablet 0  . polyethylene glycol (MIRALAX / GLYCOLAX) 17 g packet Take 17 g by mouth daily as needed for moderate constipation or severe constipation. (Patient not taking: Reported on 03/16/2020) 14 each 0  . senna-docusate (SENOKOT-S) 8.6-50 MG tablet Take 2 tablets by mouth at bedtime as needed for mild constipation or moderate constipation. (Patient not taking: Reported on 03/16/2020) 30 tablet 0   No current facility-administered medications for this visit.    ALLERGIES:  No Known Allergies  PHYSICAL EXAM:  Performance status (ECOG): 2 - Symptomatic, <50% confined to bed  Vitals:   03/16/20 1553  BP: 94/74  Pulse: (!) 116  Resp: 18  Temp: (!) 97.5 F (36.4 C)  SpO2: 100%   Wt Readings from Last 3 Encounters:  03/16/20  149 lb (67.6 kg)  03/08/20 116 lb (52.6 kg)  02/04/20 116 lb 12.8 oz (53 kg)   Physical Exam Constitutional:      General: She is not in acute distress.    Appearance: Normal appearance. She is normal weight. She is not ill-appearing.  HENT:     Mouth/Throat:     Mouth: Mucous membranes are moist.     Pharynx: No oropharyngeal exudate or posterior oropharyngeal erythema.  Eyes:     Extraocular Movements: Extraocular movements intact.     Pupils: Pupils are equal, round, and reactive to light.  Cardiovascular:     Rate and Rhythm: Normal rate and regular rhythm.     Pulses: Normal pulses.     Heart sounds: Normal heart sounds. No murmur. No friction rub. No gallop.   Pulmonary:     Effort: Pulmonary effort is normal.     Breath sounds: Normal breath sounds. No wheezing, rhonchi or rales.  Abdominal:     Palpations: There is no mass.     Tenderness: There is no abdominal tenderness. There is no guarding.  Musculoskeletal:        General: No swelling or tenderness.     Right lower leg: No edema.     Left lower leg: No edema.  Skin:    Findings: No bruising or erythema.  Neurological:      Mental Status: She is alert and oriented to person, place, and time.     Sensory: No sensory deficit.  Psychiatric:        Mood and Affect: Mood normal.        Behavior: Behavior normal.        Thought Content: Thought content normal.        Judgment: Judgment normal.     LABORATORY DATA:  I have reviewed the labs as listed.  CBC Latest Ref Rng & Units 03/10/2020 03/09/2020 03/08/2020  WBC 4.0 - 10.5 K/uL 14.0(H) 12.2(H) 11.8(H)  Hemoglobin 12.0 - 15.0 g/dL 8.0(L) 7.7(L) 7.8(L)  Hematocrit 36.0 - 46.0 % 26.6(L) 25.5(L) 26.8(L)  Platelets 150 - 400 K/uL 389 339 384   CMP Latest Ref Rng & Units 03/10/2020 03/09/2020 03/09/2020  Glucose 70 - 99 mg/dL 65(L) 97 115(H)  BUN 6 - 20 mg/dL _0 Creatinine 0.44 - 1.00 mg/dL 0.35(L) 0.32(L) 0.34(L)  Sodium 135 - 145 mmol/L 136 135 133(L)  Potassium 3.5 - 5.1 mmol/L 3.5 3.6 3.8  Chloride 98 - 111 mmol/L 101 101 100  CO2 22 - 32 mmol/L _1 Calcium 8.9 - 10.3 mg/dL 7.5(L) 7.1(L) 7.0(L)  Total Protein 6.5 - 8.1 g/dL 5.0(L) 5.0(L) -  Total Bilirubin 0.3 - 1.2 mg/dL 1.6(H) 1.5(H) -  Alkaline Phos 38 - 126 U/L 273(H) 283(H) -  AST 15 - 41 U/L 56(H) 54(H) -  ALT 0 - 44 U/L 15 15 -    DIAGNOSTIC IMAGING:  I have independently reviewed the scans and discussed with the patient. CT CHEST W CONTRAST  Result Date: 03/10/2020 CLINICAL DATA:  Cancer of unknown primary, staging EXAM: CT CHEST WITH CONTRAST TECHNIQUE: Multidetector CT imaging of the chest was performed during intravenous contrast administration. CONTRAST:  33m OMNIPAQUE IOHEXOL 300 MG/ML  SOLN COMPARISON:  None . FINDINGS: Cardiovascular: No significant vascular findings. Normal heart size. No pericardial effusion. Mediastinum/Nodes: No enlarged mediastinal, hilar, or axillary lymph nodes. Thyroid gland, trachea, and esophagus demonstrate no significant findings. Lungs/Pleura: Moderate volume right pleural effusion is identified. Small left pleural  effusion also noted. Approximately 50%  atelectasis of the right lower lobe noted. Passive atelectasis is noted overlying the left pleural effusion. Near complete atelectasis of the right middle lobe noted. No pulmonary nodule or mass identified. Upper Abdomen: Marked hepatomegaly with extensive liver metastases identified. Musculoskeletal: Spondylosis identified within the thoracic spine. No acute or aggressive osseous lesions identified. IMPRESSION: 1. No mass or adenopathy identified within the chest. 2. Moderate volume right pleural effusion and small left pleural effusion. 3. Near complete atelectasis of the right middle lobe and right lower lobe. With subsegmental atelectasis of the left lower lobe. 4. Marked hepatomegaly with extensive liver metastases. Electronically Signed   By: Kerby Moors M.D.   On: 03/10/2020 19:53   CT ABDOMEN PELVIS W CONTRAST  Result Date: 03/08/2020 CLINICAL DATA:  Abdominal pain. Evaluate for bowel obstruction. EXAM: CT ABDOMEN AND PELVIS WITH CONTRAST TECHNIQUE: Multidetector CT imaging of the abdomen and pelvis was performed using the standard protocol following bolus administration of intravenous contrast. CONTRAST:  4m OMNIPAQUE IOHEXOL 300 MG/ML  SOLN COMPARISON:  None. FINDINGS: Lower chest: Calcification identified within the LAD coronary artery. The heart size appears within normal limits. No pericardial effusion. Small right pleural effusion. Imaged portions of the lungs are otherwise clear. Hepatobiliary: The liver is enlarged. There are innumerable ill-defined, peripherally enhancing lesions throughout the liver compatible with metastatic disease. Lesions are too numerous to count including: Index segment 4 lesion measures 7.7 x 6.4 cm, image 26/2. Index lesion within segment 8 measures 7.4 x 8.4 cm. Index lesion within segment 2 measures 4.3 by 3.4 cm, image 25/2. Evidence of intrahepatic biliary obstruction is noted with dilatation of the intrahepatic bile ducts and normal common bile duct, image  23/2. Gallbladder negative. Pancreas: Unremarkable. No pancreatic ductal dilatation or surrounding inflammatory changes. Spleen: Normal in size without focal abnormality. Adrenals/Urinary Tract: Normal adrenal glands. No kidney mass or hydronephrosis identified bilaterally. Bladder unremarkable. Stomach/Bowel: Stomach is nondistended. No bowel wall thickening, no small bowel wall thickening, inflammation or distension. Circumferential mass involving the hepatic flexure is identified this, image 48/2 and image 53/6. This involves a approximate 5.8 cm segment of colon, image 48/6. No obstruction. Vascular/Lymphatic: Aortic atherosclerosis without aneurysm. Enlarged ileocolic lymph node measures 2.2 x 2.3 cm. Gastrohepatic ligament lymph node measures 1.3 cm short axis, image 31/2. Prominent but non pathologically enlarged external iliac lymph nodes are identified including 9 mm left external iliac node, image 75/2. Reproductive: The uterus is enlarged and contains 2 high attenuation masses which are favored to represent fibroids. The largest measures 4 cm, image 69/7. No adnexal mass identified. Other: Moderate volume of ascites is noted within the abdomen and pelvis. Although no definite peritoneal nodule or mass identified, peritoneal carcinomatosis cannot be excluded. Diffuse body wall edema is identified compatible with anasarca. Musculoskeletal: No acute or suspicious bone lesions identified. IMPRESSION: 1. Nonobstructing circumferential mass involving the hepatic flexure is identified concerning for colon cancer. Further investigation with colonoscopy is advised. 2. Innumerable liver metastases are noted. Additionally, there are enlarged ileocolic and gastrohepatic ligament lymph node concerning for metastatic adenopathy. 3. Moderate volume of ascites noted within the abdomen and pelvis. Although no definite peritoneal nodule or mass identified, peritoneal carcinomatosis cannot be excluded. Consider further  evaluation with diagnostic paracentesis. 4. Small right pleural effusion. 5. Aortic atherosclerosis. LAD coronary artery calcifications noted. 6. Enlarged uterus containing 2 high attenuation masses which are favored to represent fibroids. Aortic Atherosclerosis (ICD10-I70.0). Electronically Signed   By: TQueen SloughD.  On: 03/08/2020 15:14     ASSESSMENT & PLAN:  Malignant neoplasm of hepatic flexure ([facility]) 1.  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 -Colonoscopy showed near obstructing exophytic necrotic mass appearing at the hepatic flexure/distal ascending segment.  Biopsies consistent with adenocarcinoma. -CEA was elevated at 1818. -CT of the chest did not show any metastatic disease.  Bilateral pleural effusions, right greater than left. -She does not have any obstructive symptoms at this time.  She was not felt to be a good candidate for diverting colostomy/ileostomy because of her ascites. -We discussed about palliative chemotherapy versus best supportive care in the form of hospice.  Patient would like to receive active therapy at this time.  We talked about prognosis of metastatic colon cancer. -She is currently living with her sister.  Sister is present today with her.  She needs port placement for chemotherapy administration. -We will send her tumor for NGS testing.  We will also check MSI status. -We will consider FOLFOX plus bevacizumab.  We will see her back after port placement.  2.  Low back pain: -Patient reports low back pain which goes across her lower back. -No evidence of musculoskeletal meta stasis based on CT scan.  Patient reports that she had pain even prior to hospitalization. -She is currently  taking oxycodone 5 mg 4 to 5 tablets daily.  Its not controlling it well. -I will start her on fentanyl 25 mcg patch.  She will continue taking oxycodone 5 mg 1 to 2 tablets as needed for breakthrough pain.  I have recommended stool softeners.  3.  Normocytic anemia: -Last hemoglobin was 8.0 at the time of discharge.  MCV was 84.  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 -We will consider parenteral iron therapy with Feraheme x2.  4.  Ascites: -She has hypoalbuminemia with ascites.  She also has 2+ edema bilaterally. -Her blood pressure is borderline systolic at 38/93.  I am reluctant to consider Lasix at this time.    Orders placed this encounter:  No orders of the defined types were placed in this encounter.   Total time spent is 40 minutes with more than 50% of the time spent face-to-face discussing pathology report, scan results, prognosis, treatment plan, counseling and coordination of care.    Derek Jack, MD, 03/16/20 6:39 PM  [place] [phone]   I, Jacqualyn Posey, am acting as a scribe for Dr. Sanda Linger.  I, Derek Jack MD, have reviewed the above documentation for accuracy and completeness, and I agree with the above.

## 2020-03-16 NOTE — Patient Instructions (Addendum)
[place] at [facility] Discharge Instructions  You were seen today by Dr. Delton Coombes. He went over your recent results and diagnosis of colon cancer metastatic to the liver. We will be scheduling you to get a port placement to make it easier to receive chemotherapy treatment. We will plan for you to receive FOLFOX, a standard chemotherapy treatment for your cancer. He prescribed a fentanyl patch to be changed every three days. You can take the oxycodone for breakthrough pain. Pain medications may make you constipated, so you can add a stool softener to your daily routine. Dr. Delton Coombes will see you back in for labs and follow up.   Thank you for choosing [place] at [facility] to provide your oncology and hematology care.  To afford each patient quality time with our provider, please arrive at least 15 minutes before your scheduled appointment time.   If you have a lab appointment with the [facility] please come in thru the  Main Entrance and check in at the main information desk  You need to re-schedule your appointment should you arrive 10 or more minutes late.  We strive to give you quality time with our providers, and arriving late affects you and other patients whose appointments are after yours.  Also, if you no show three or more times for appointments you may be dismissed from the clinic at the providers discretion.     Again, thank you for choosing [facility]  Our hope is that these requests will decrease the amount of time that you wait before being seen by our physicians.       _____________________________________________________________  Should you have questions after your visit to [facility], please contact our office at [phone] between the hours of 8:00 a.m. and 4:30 p.m.  Voicemails left after 4:00 p.m. will not be returned until the following business day.  For prescription refill requests,  have your pharmacy contact our office and allow 72 hours.    Cancer Center Support Programs:   > Cancer Support Group  2nd Tuesday of the month 1pm-2pm, Journey Room

## 2020-03-17 ENCOUNTER — Other Ambulatory Visit (HOSPITAL_COMMUNITY): Payer: Self-pay | Admitting: Nurse Practitioner

## 2020-03-17 ENCOUNTER — Telehealth (HOSPITAL_COMMUNITY): Payer: Self-pay | Admitting: Hematology

## 2020-03-17 DIAGNOSIS — C183 Malignant neoplasm of hepatic flexure: Secondary | ICD-10-CM

## 2020-03-17 MED ORDER — FENTANYL 25 MCG/HR TD PT72: 1.0000 | MEDICATED_PATCH | TRANSDERMAL | 0 refills | Status: AC

## 2020-03-17 NOTE — Telephone Encounter (Signed)
Spk to pts sister and I advised her of our assistance program. Advised her that I think her sister is eligible. Pt has no income and is living with her. Advised sister to come by and fill out a letter of support. Also advised her that we will cover the cost of the pts fentanyl patches.

## 2020-03-20 NOTE — Patient Instructions (Signed)
Natalie Washington  03/20/2020     @PREFPERIOPPHARMACY @   Your procedure is scheduled on  03/23/2020 .  Report to [facility] at  0930  A.M.  Call this number if you have problems the morning of surgery:  [phone]   Remember:  Do not eat or drink after midnight.                         Take these medicines the morning of surgery with A SIP OF WATER  Oxycodone(if needed), protonix.    Do not wear jewelry, make-up or nail polish.  Do not wear lotions, powders, or perfumes, or deodorant. Please brush your teeth.  Do not shave 48 hours prior to surgery.  Men may shave face and neck.  Do not bring valuables to the hospital.  [facility] is not responsible for any belongings or valuables.  Contacts, dentures or bridgework may not be worn into surgery.  Leave your suitcase in the car.  After surgery it may be brought to your room.  For patients admitted to the hospital, discharge time will be determined by your treatment team.  Patients discharged the day of surgery will not be allowed to drive home.   Name and phone number of your driver:   family Special instructions:  DO NOT smoke the morning of your procedure.  Please read over the following fact sheets that you were given. Anesthesia Post-op Instructions and Care and Recovery After Surgery       Implanted Port Insertion, Care After This sheet gives you information about how to care for yourself after your procedure. Your health care provider may also give you more specific instructions. If you have problems or questions, contact your health care provider. What can I expect after the procedure? After the procedure, it is common to have:  Discomfort at the port insertion site.  Bruising on the skin over the port. This should improve over 3-4 days. Follow these instructions at home: [facility] care  After your port is placed, you will get a manufacturer's information card. The card has information about your port.  Keep this card with you at all times.  Take care of the port as told by your health care provider. Ask your health care provider if you or a family member can get training for taking care of the port at home. A home health care nurse may also take care of the port.  Make sure to remember what type of port you have. Incision care      Follow instructions from your health care provider about how to take care of your port insertion site. Make sure you: ? Wash your hands with soap and water before and after you change your bandage (dressing). If soap and water are not available, use hand sanitizer. ? Change your dressing as told by your health care provider. ? Leave stitches (sutures), skin glue, or adhesive strips in place. These skin closures may need to stay in place for 2 weeks or longer. If adhesive strip edges start to loosen and curl up, you may trim the loose edges. Do not remove adhesive strips completely unless your health care provider tells you to do that.  Check your port insertion site every day for signs of infection. Check for: ? Redness, swelling, or pain. ? Fluid or blood. ? Warmth. ? Pus or a bad smell. Activity  Return to your normal activities as  told by your health care provider. Ask your health care provider what activities are safe for you.  Do not lift anything that is heavier than 10 lb (4.5 kg), or the limit that you are told, until your health care provider says that it is safe. General instructions  Take over-the-counter and prescription medicines only as told by your health care provider.  Do not take baths, swim, or use a hot tub until your health care provider approves. Ask your health care provider if you may take showers. You may only be allowed to take sponge baths.  Do not drive for 24 hours if you were given a sedative during your procedure.  Wear a medical alert bracelet in case of an emergency. This will tell any health care providers that you have  a port.  Keep all follow-up visits as told by your health care provider. This is important. Contact a health care provider if:  You cannot flush your port with saline as directed, or you cannot draw blood from the port.  You have a fever or chills.  You have redness, swelling, or pain around your port insertion site.  You have fluid or blood coming from your port insertion site.  Your port insertion site feels warm to the touch.  You have pus or a bad smell coming from the port insertion site. Get help right away if:  You have chest pain or shortness of breath.  You have bleeding from your port that you cannot control. Summary  Take care of the port as told by your health care provider. Keep the manufacturer's information card with you at all times.  Change your dressing as told by your health care provider.  Contact a health care provider if you have a fever or chills or if you have redness, swelling, or pain around your port insertion site.  Keep all follow-up visits as told by your health care provider. This information is not intended to replace advice given to you by your health care provider. Make sure you discuss any questions you have with your health care provider. Document Revised: 05/12/2018 Document Reviewed: 05/12/2018 Elsevier Patient Education  [place] An implanted port is a device that is placed under the skin. It is usually placed in the chest. The device can be used to give IV medicine, to take blood, or for dialysis. You may have an implanted port if:  You need IV medicine that would be irritating to the small veins in your hands or arms.  You need IV medicines, such as antibiotics, for a long period of time.  You need IV nutrition for a long period of time.  You need dialysis. Having a port means that your health care provider will not need to use the veins in your arms for these procedures. You may have fewer  limitations when using a port than you would if you used other types of long-term IVs, and you will likely be able to return to normal activities after your incision heals. An implanted port has two main parts:  Reservoir. The reservoir is the part where a needle is inserted to give medicines or draw blood. The reservoir is round. After it is placed, it appears as a small, raised area under your skin.  Catheter. The catheter is a thin, flexible tube that connects the reservoir to a vein. Medicine that is inserted into the reservoir goes into the catheter and then into the vein. How  is my port accessed? To access your port:  A numbing cream may be placed on the skin over the port site.  Your health care provider will put on a mask and sterile gloves.  The skin over your port will be cleaned carefully with a germ-killing soap and allowed to dry.  Your health care provider will gently pinch the port and insert a needle into it.  Your health care provider will check for a blood return to make sure the port is in the vein and is not clogged.  If your port needs to remain accessed to get medicine continuously (constant infusion), your health care provider will place a clear bandage (dressing) over the needle site. The dressing and needle will need to be changed every week, or as told by your health care provider. What is flushing? Flushing helps keep the port from getting clogged. Follow instructions from your health care provider about how and when to flush the port. Ports are usually flushed with saline solution or a medicine called heparin. The need for flushing will depend on how the port is used:  If the port is only used from time to time to give medicines or draw blood, the port may need to be flushed: ? Before and after medicines have been given. ? Before and after blood has been drawn. ? As part of routine maintenance. Flushing may be recommended every 4-6 weeks.  If a constant  infusion is running, the port may not need to be flushed.  Throw away any syringes in a disposal container that is meant for sharp items (sharps container). You can buy a sharps container from a pharmacy, or you can make one by using an empty hard plastic bottle with a cover. How long will my port stay implanted? The port can stay in for as long as your health care provider thinks it is needed. When it is time for the port to come out, a surgery will be done to remove it. The surgery will be similar to the procedure that was done to put the port in. Follow these instructions at home:   Flush your port as told by your health care provider.  If you need an infusion over several days, follow instructions from your health care provider about how to take care of your port site. Make sure you: ? Wash your hands with soap and water before you change your dressing. If soap and water are not available, use alcohol-based hand sanitizer. ? Change your dressing as told by your health care provider. ? Place any used dressings or infusion bags into a plastic bag. Throw that bag in the trash. ? Keep the dressing that covers the needle clean and dry. Do not get it wet. ? Do not use scissors or sharp objects near the tube. ? Keep the tube clamped, unless it is being used.  Check your port site every day for signs of infection. Check for: ? Redness, swelling, or pain. ? Fluid or blood. ? Pus or a bad smell.  Protect the skin around the port site. ? Avoid wearing bra straps that rub or irritate the site. ? Protect the skin around your port from seat belts. Place a soft pad over your chest if needed.  Bathe or shower as told by your health care provider. The site may get wet as long as you are not actively receiving an infusion.  Return to your normal activities as told by your health care provider. Ask your  health care provider what activities are safe for you.  Carry a medical alert card or wear a  medical alert bracelet at all times. This will let health care providers know that you have an implanted port in case of an emergency. Get help right away if:  You have redness, swelling, or pain at the port site.  You have fluid or blood coming from your port site.  You have pus or a bad smell coming from the port site.  You have a fever. Summary  Implanted ports are usually placed in the chest for long-term IV access.  Follow instructions from your health care provider about flushing the port and changing bandages (dressings).  Take care of the area around your port by avoiding clothing that puts pressure on the area, and by watching for signs of infection.  Protect the skin around your port from seat belts. Place a soft pad over your chest if needed.  Get help right away if you have a fever or you have redness, swelling, pain, drainage, or a bad smell at the port site. This information is not intended to replace advice given to you by your health care provider. Make sure you discuss any questions you have with your health care provider. Document Revised: 02/05/2019 Document Reviewed: 11/16/2016 Elsevier Patient Education  [place]. How to Use Chlorhexidine for Bathing Chlorhexidine gluconate (CHG) is a germ-killing (antiseptic) solution that is used to clean the skin. It can get rid of the bacteria that normally live on the skin and can keep them away for about 24 hours. To clean your skin with CHG, you may be given:  A CHG solution to use in the shower or as part of a sponge bath.  A prepackaged cloth that contains CHG. Cleaning your skin with CHG may help lower the risk for infection:  While you are staying in the intensive care unit of the hospital.  If you have a vascular access, such as a central line, to provide short-term or long-term access to your veins.  If you have a catheter to drain urine from your bladder.  If you are on a ventilator. A ventilator is a  machine that helps you breathe by moving air in and out of your lungs.  After surgery. What are the risks? Risks of using CHG include:  A skin reaction.  Hearing loss, if CHG gets in your ears.  Eye injury, if CHG gets in your eyes and is not rinsed out.  The CHG product catching fire. Make sure that you avoid smoking and flames after applying CHG to your skin. Do not use CHG:  If you have a chlorhexidine allergy or have previously reacted to chlorhexidine.  On babies younger than 8 months of age. How to use CHG solution  Use CHG only as told by your health care provider, and follow the instructions on the label.  Use the full amount of CHG as directed. Usually, this is one bottle. During a shower Follow these steps when using CHG solution during a shower (unless your health care provider gives you different instructions): 1. Start the shower. 2. Use your normal soap and shampoo to wash your face and hair. 3. Turn off the shower or move out of the shower stream. 4. Pour the CHG onto a clean washcloth. Do not use any type of brush or rough-edged sponge. 5. Starting at your neck, lather your body down to your toes. Make sure you follow these instructions: ? If  you will be having surgery, pay special attention to the part of your body where you will be having surgery. Scrub this area for at least 1 minute. ? Do not use CHG on your head or face. If the solution gets into your ears or eyes, rinse them well with water. ? Avoid your genital area. ? Avoid any areas of skin that have broken skin, cuts, or scrapes. ? Scrub your back and under your arms. Make sure to wash skin folds. 6. Let the lather sit on your skin for 1-2 minutes or as long as told by your health care provider. 7. Thoroughly rinse your entire body in the shower. Make sure that all body creases and crevices are rinsed well. 8. Dry off with a clean towel. Do not put any substances on your body afterward--such as powder,  lotion, or perfume--unless you are told to do so by your health care provider. Only use lotions that are recommended by the manufacturer. 9. Put on clean clothes or pajamas. 10. If it is the night before your surgery, sleep in clean sheets.  During a sponge bath Follow these steps when using CHG solution during a sponge bath (unless your health care provider gives you different instructions): 1. Use your normal soap and shampoo to wash your face and hair. 2. Pour the CHG onto a clean washcloth. 3. Starting at your neck, lather your body down to your toes. Make sure you follow these instructions: ? If you will be having surgery, pay special attention to the part of your body where you will be having surgery. Scrub this area for at least 1 minute. ? Do not use CHG on your head or face. If the solution gets into your ears or eyes, rinse them well with water. ? Avoid your genital area. ? Avoid any areas of skin that have broken skin, cuts, or scrapes. ? Scrub your back and under your arms. Make sure to wash skin folds. 4. Let the lather sit on your skin for 1-2 minutes or as long as told by your health care provider. 5. Using a different clean, wet washcloth, thoroughly rinse your entire body. Make sure that all body creases and crevices are rinsed well. 6. Dry off with a clean towel. Do not put any substances on your body afterward--such as powder, lotion, or perfume--unless you are told to do so by your health care provider. Only use lotions that are recommended by the manufacturer. 7. Put on clean clothes or pajamas. 8. If it is the night before your surgery, sleep in clean sheets. How to use CHG prepackaged cloths  Only use CHG cloths as told by your health care provider, and follow the instructions on the label.  Use the CHG cloth on clean, dry skin.  Do not use the CHG cloth on your head or face unless your health care provider tells you to.  When washing with the CHG cloth: ? Avoid  your genital area. ? Avoid any areas of skin that have broken skin, cuts, or scrapes. Before surgery Follow these steps when using a CHG cloth to clean before surgery (unless your health care provider gives you different instructions): 1. Using the CHG cloth, vigorously scrub the part of your body where you will be having surgery. Scrub using a back-and-forth motion for 3 minutes. The area on your body should be completely wet with CHG when you are done scrubbing. 2. Do not rinse. Discard the cloth and let the area air-dry. Do  not put any substances on the area afterward, such as powder, lotion, or perfume. 3. Put on clean clothes or pajamas. 4. If it is the night before your surgery, sleep in clean sheets.  For general bathing Follow these steps when using CHG cloths for general bathing (unless your health care provider gives you different instructions). 1. Use a separate CHG cloth for each area of your body. Make sure you wash between any folds of skin and between your fingers and toes. Wash your body in the following order, switching to a new cloth after each step: ? The front of your neck, shoulders, and chest. ? Both of your arms, under your arms, and your hands. ? Your stomach and groin area, avoiding the genitals. ? Your right leg and foot. ? Your left leg and foot. ? The back of your neck, your back, and your buttocks. 2. Do not rinse. Discard the cloth and let the area air-dry. Do not put any substances on your body afterward--such as powder, lotion, or perfume--unless you are told to do so by your health care provider. Only use lotions that are recommended by the manufacturer. 3. Put on clean clothes or pajamas. Contact a health care provider if:  Your skin gets irritated after scrubbing.  You have questions about using your solution or cloth. Get help right away if:  Your eyes become very red or swollen.  Your eyes itch badly.  Your skin itches badly and is red or  swollen.  Your hearing changes.  You have trouble seeing.  You have swelling or tingling in your mouth or throat.  You have trouble breathing.  You swallow any chlorhexidine. Summary  Chlorhexidine gluconate (CHG) is a germ-killing (antiseptic) solution that is used to clean the skin. Cleaning your skin with CHG may help to lower your risk for infection.  You may be given CHG to use for bathing. It may be in a bottle or in a prepackaged cloth to use on your skin. Carefully follow your health care provider's instructions and the instructions on the product label.  Do not use CHG if you have a chlorhexidine allergy.  Contact your health care provider if your skin gets irritated after scrubbing. This information is not intended to replace advice given to you by your health care provider. Make sure you discuss any questions you have with your health care provider. Document Revised: 12/31/2018 Document Reviewed: 09/11/2017 Elsevier Patient Education  [place].

## 2020-03-21 ENCOUNTER — Other Ambulatory Visit: Payer: Self-pay

## 2020-03-21 ENCOUNTER — Other Ambulatory Visit (HOSPITAL_COMMUNITY): Payer: Self-pay | Admitting: *Deleted

## 2020-03-21 ENCOUNTER — Encounter (HOSPITAL_COMMUNITY): Payer: Self-pay

## 2020-03-21 ENCOUNTER — Inpatient Hospital Stay (HOSPITAL_COMMUNITY): Payer: Medicaid Other | Admitting: General Practice

## 2020-03-21 ENCOUNTER — Other Ambulatory Visit (HOSPITAL_COMMUNITY)
Admission: RE | Admit: 2020-03-21 | Discharge: 2020-03-21 | Disposition: A | Discharge status: Home / Self Care | Payer: HRSA Program | Source: Ambulatory Visit | Attending: General Surgery | Admitting: General Surgery

## 2020-03-21 ENCOUNTER — Inpatient Hospital Stay (HOSPITAL_COMMUNITY): Payer: Medicaid Other

## 2020-03-21 ENCOUNTER — Ambulatory Visit (INDEPENDENT_AMBULATORY_CARE_PROVIDER_SITE_OTHER): Payer: Self-pay | Admitting: General Surgery

## 2020-03-21 ENCOUNTER — Encounter: Payer: Self-pay | Admitting: General Surgery

## 2020-03-21 ENCOUNTER — Encounter (HOSPITAL_COMMUNITY)
Admission: RE | Admit: 2020-03-21 | Discharge: 2020-03-21 | Disposition: A | Discharge status: Home / Self Care | Payer: Self-pay | Source: Ambulatory Visit | Attending: General Surgery | Admitting: General Surgery

## 2020-03-21 VITALS — BP 96/76 | HR 116 | Temp 96.4°F | Resp 12 | Ht 61.0 in | Wt 141.0 lb

## 2020-03-21 DIAGNOSIS — C183 Malignant neoplasm of hepatic flexure: Secondary | ICD-10-CM

## 2020-03-21 DIAGNOSIS — Z01812 Encounter for preprocedural laboratory examination: Secondary | ICD-10-CM | POA: Insufficient documentation

## 2020-03-21 DIAGNOSIS — Z20822 Contact with and (suspected) exposure to covid-19: Secondary | ICD-10-CM | POA: Diagnosis not present

## 2020-03-21 DIAGNOSIS — C787 Secondary malignant neoplasm of liver and intrahepatic bile duct: Secondary | ICD-10-CM

## 2020-03-21 MED ORDER — PROCHLORPERAZINE MALEATE 10 MG PO TABS
10.0000 mg | ORAL_TABLET | Freq: Four times a day (QID) | ORAL | 2 refills | Status: DC | PRN
Start: 2020-03-21 — End: 2020-03-21

## 2020-03-21 MED ORDER — LIDOCAINE-PRILOCAINE 2.5-2.5 % EX CREA: TOPICAL_CREAM | CUTANEOUS | 3 refills | Status: DC

## 2020-03-21 MED ORDER — PROCHLORPERAZINE MALEATE 10 MG PO TABS: 10.0000 mg | ORAL_TABLET | Freq: Four times a day (QID) | ORAL | 2 refills | Status: AC | PRN

## 2020-03-21 MED ORDER — LIDOCAINE-PRILOCAINE 2.5-2.5 % EX CREA: TOPICAL_CREAM | CUTANEOUS | 3 refills | Status: AC

## 2020-03-21 MED FILL — PROCHLORPERAZINE 10 MG TAB: 10 | 11 days supply | Qty: 45 | Fill #0

## 2020-03-21 MED FILL — LIDOCAINE-PRILOCAINE CREAM: 2.5-2.5 | 30 days supply | Qty: 30 | Fill #0

## 2020-03-21 NOTE — Patient Instructions (Signed)
Implanted Port Insertion Implanted port insertion is a procedure to put in a port and catheter. The port is a device with an injectable disk that can be accessed by your health care provider. The port is connected to a vein in the chest or neck by a small flexible tube (catheter). There are different types of ports. The implanted port may be used as a long-term IV access for:  Medicines, such as chemotherapy.  Fluids.  Liquid nutrition, such as total parenteral nutrition (TPN). When you have a port, this means that your health care provider will not need to use the veins in your arms for these procedures. Tell a health care provider about:  Any allergies you have.  All medicines you are taking, especially blood thinners, as well as any vitamins, herbs, eye drops, creams, over-the-counter medicines, and steroids.  Any problems you or family members have had with anesthetic medicines.  Any blood disorders you have.  Any surgeries you have had.  Any medical conditions you have or have had, including diabetes or kidney problems.  Whether you are pregnant or may be pregnant. What are the risks? Generally, this is a safe procedure. However, problems may occur, including:  Allergic reactions to medicines or dyes.  Damage to other structures or organs.  Infection.  Damage to the blood vessel, bruising, or bleeding at the puncture site.  Blood clot.  Breakdown of the skin over the port.  A collection of air in the chest that can cause one of the lungs to collapse (pneumothorax). This is rare. What happens before the procedure? Medicines  Ask your health care provider about: ? Changing or stopping your regular medicines. This is especially important if you are taking diabetes medicines or blood thinners. ? Taking medicines such as aspirin and ibuprofen. These medicines can thin your blood. Do not take these medicines unless your health care provider tells you to take them. ?  Taking over-the-counter medicines, vitamins, herbs, and supplements. General instructions  Plan to have someone take you home from the hospital or clinic.  If you will be going home right after the procedure, plan to have someone with you for 24 hours.  You may have blood tests.  Do not use any products that contain nicotine or tobacco for at least 4-6 weeks before the procedure. These products include cigarettes, e-cigarettes, and chewing tobacco. If you need help quitting, ask your health care provider.  Ask your health care provider what steps will be taken to help prevent infection. These may include: ? Removing hair at the surgery site. ? Washing skin with a germ-killing soap. ? Taking antibiotic medicine. What happens during the procedure?   An IV will be inserted into one of your veins.  You will be given one or more of the following: ? A medicine to help you relax (sedative). ? A medicine to numb the area (local anesthetic).  Two small incisions will be made to insert the port. ? One smaller incision will be made in your neck to get access to the vein where the catheter will lie. ? The other incision will be made in the upper chest. This is where the port will lie.  The procedure may be done using continuous X-ray (fluoroscopy) or other imaging tools for guidance.  The port and catheter will be placed. There may be a small, raised area where the port is.  The port will be flushed with a salt solution (saline), and blood will be drawn to make   sure that it is working correctly.  The incisions will be closed.  Bandages (dressings) may be placed over the incisions. The procedure may vary among health care providers and hospitals. What happens after the procedure?  Your blood pressure, heart rate, breathing rate, and blood oxygen level will be monitored until you leave the hospital or clinic.  Do not drive for 24 hours if you were given a sedative during your procedure.   You will be given a manufacturer's information card for the type of port that you have. Keep this with you.  Your port will need to be flushed and checked as told by your health care provider, usually every few weeks.  A chest X-ray will be done to: ? Check the placement of the port. ? Make sure there is no injury to your lung. Summary  Implanted port insertion is a procedure to put in a port and catheter.  The implanted port is used as a long-term IV access.  The port will need to be flushed and checked as told by your health care provider, usually every few weeks.  Keep your manufacturer's information card with you at all times. This information is not intended to replace advice given to you by your health care provider. Make sure you discuss any questions you have with your health care provider. Document Revised: 02/05/2019 Document Reviewed: 05/12/2018 Elsevier Patient Education  2020 Elsevier Inc.  

## 2020-03-21 NOTE — Progress Notes (Signed)
1400:  Chemotherapy education packet given and discussed with pt and family in detail.  Discussed diagnosis and staging, tx regimen, and intent of tx.  Reviewed chemotherapy medications and side effects, as well as pre-medications.  Instructed on how to manage side effects at home, and when to call the clinic.  Importance of fever/chills discussed with pt and family. Discussed precautions to implement at home after receiving tx, as well as self care strategies. Phone numbers provided for clinic during regular working hours, also how to reach the clinic after hours and on weekends. Pt and family provided the opportunity to ask questions - all questions answered to pt's and family satisfaction.

## 2020-03-21 NOTE — Progress Notes (Signed)
[facility] History and Physical  Reason for Referral: Port placement Referring Physician: Dr. Delton Coombes   Chief Complaint    Port a cath placement      Natalie Washington is a 54 y.o. female.  HPI: Natalie Washington is a 54 yo with metastatic colon cancer to the liver and near obstructing ascending colon mass. She is currently still eating and drinking, and is having regular Bms. I met her in the hospital and she had wanted to be discharged to discuss her options with her family.  She has decided to undergo palliative chemotherapy. We have been asked to place a port.   Past Medical History:  Diagnosis Date  . Acid reflux   . Pneumonia    1980    Past Surgical History:  Procedure Laterality Date  . BIOPSY  03/09/2020   Procedure: BIOPSY;  Surgeon: Daneil Dolin, MD;  Location: [facility];  Service: Endoscopy;;  . COLONOSCOPY WITH PROPOFOL N/A 03/09/2020   Procedure: COLONOSCOPY WITH PROPOFOL;  Surgeon: Daneil Dolin, MD;  Location: [facility];  Service: Endoscopy;  Laterality: N/A;  . ORIF HUMERUS FRACTURE Right 04/07/2019   Procedure: OPEN REDUCTION INTERNAL FIXATION (ORIF) PROXIMAL HUMERUS FRACTURE; BICEP TENODESIS;  Surgeon: Hiram Gash, MD;  Location: [facility];  Service: Orthopedics;  Laterality: Right;  . POLYPECTOMY  03/09/2020   Procedure: POLYPECTOMY;  Surgeon: Daneil Dolin, MD;  Location: [facility];  Service: Endoscopy;;  . WISDOM TOOTH EXTRACTION      Family History  Problem Relation Age of Onset  . Colon cancer Father        unsure age of onset. Passed away in January 21, 2005    Social History   Tobacco Use  . Smoking status: Current Every Day Smoker    Packs/day: 0.25    Years: 15.00    Pack years: 3.75  . Smokeless tobacco: Never Used  Substance Use Topics  . Alcohol use: Yes    Comment: once a week  . Drug use: Never    Medications: I have reviewed the patient's current medications. Allergies as of 03/21/2020   No Known Allergies     Medication List       Accurate as of Mar 21, 2020 10:18 AM. If you have any questions, ask your nurse or doctor.        fentaNYL 25 MCG/HR Commonly known as: [place] 1 patch onto the skin every 3 (three) days.   oxyCODONE 5 MG immediate release tablet Commonly known as: Oxy IR/ROXICODONE Take 1 tablet (5 mg total) by mouth every 6 (six) hours as needed for moderate pain or severe pain.   pantoprazole 40 MG tablet Commonly known as: Protonix Take 1 tablet (40 mg total) by mouth 2 (two) times daily.   polyethylene glycol 17 g packet Commonly known as: MIRALAX / GLYCOLAX Take 17 g by mouth daily as needed for moderate constipation or severe constipation.   senna-docusate 8.6-50 MG tablet Commonly known as: Senokot-S Take 2 tablets by mouth at bedtime as needed for mild constipation or moderate constipation.   Vitamin D (Ergocalciferol) 1.25 MG (50000 UNIT) Caps capsule Commonly known as: DRISDOL Take 1 capsule (50,000 Units total) by mouth every 7 (seven) days for 7 doses.        ROS:  A comprehensive review of systems was negative except for: Constitutional: positive for malaise Gastrointestinal: positive for abdominal pain  Blood pressure 96/76, pulse (!) 116, temperature (!) 96.4 F (35.8 C), temperature  source Other (Comment), resp. rate 12, height '5\' 1"'  (1.549 m), weight 141 lb (64 kg), SpO2 95 %. Physical Exam Vitals reviewed.  HENT:     Head: Normocephalic.     Nose: Nose normal.  Eyes:     Pupils: Pupils are equal, round, and reactive to light.  Cardiovascular:     Rate and Rhythm: Normal rate.  Pulmonary:     Effort: Pulmonary effort is normal.  Abdominal:     General: There is distension.     Palpations: Abdomen is soft.     Tenderness: There is no abdominal tenderness.  Musculoskeletal:        General: No swelling. Normal range of motion.     Cervical back: Normal range of motion.  Skin:    General: Skin is warm and dry.  Neurological:      General: No focal deficit present.     Mental Status: She is alert and oriented to person, place, and time.  Psychiatric:        Mood and Affect: Mood normal.        Behavior: Behavior normal.        Thought Content: Thought content normal.        Judgment: Judgment normal.    Results: CT a/p/c reviewed personally- metastatic colon cancer with liver mets, colon not obstructed on CT  Assessment & Plan:  Natalie Washington is a 54 y.o. female with metastatic colon cancer who wants to undergo a port for palliative chemotherapy.   -Discussed the risk of bleeding, infection, injury to vessels, pneumothorax.  Will plan for a left sided port.   All questions were answered to the satisfaction of the patient and family.  Virl Cagey 03/21/2020, 10:18 AM

## 2020-03-21 NOTE — Patient Instructions (Signed)
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  You are going to be treated with the intention of controlling your disease. This means that we can not cure it, but we can try to control it from growing.  You will get treatment every 2 weeks (14 days) with Adrucil (fluorouracil), Leucovorin, Eloxatin (oxaliplatin), Avastin (bevacizumab).  You will see the doctor regularly throughout treatment.  We will obtain blood work from you prior to every treatment and monitor your results to make sure it is safe to give your treatment. The doctor monitors your response to treatment by the way you are feeling, your blood work, and by obtaining scans periodically.  There will be wait times while you are here for treatment.  It will take about 30 minutes to 1 hour for your lab work to result.  Then there will be wait times while pharmacy mixes your medications.    You will have pre-medications that you receive prior to each chemotherapy: Premeds:  Aloxi - high powered nausea/vomiting prevention medication used for chemotherapy patients.  Dexamethasone - steroid - given to reduce the risk of you having an allergic type reaction to the chemotherapy. Dexamethasone can cause you to feel energized, nervous/anxious/jittery, make you have trouble sleeping, and/or make you feel hot/flushed in the face/neck and/or look pink/red in the face/neck. These side effects will pass as the medication wears off.    POTENTIAL SIDE EFFECTS OF TREATMENT: 5-Fluorouracil (Adrucil)  About This Drug Fluorouracil is used to treat cancer. It is given in the vein (IV).  This is the drug that will go home with you in your ambulatory pump.   Possible Side Effects . Hair loss. Hair loss is often temporary, although with certain medicine, hair loss can sometimes be permanent. Hair loss may happen suddenly or gradually. If you lose hair, you may  lose it from your head, face, armpits, pubic area, chest, and/or legs. You may also notice your hair getting thin. . Changes in your nail color, nail loss and/or brittle nail . Darkening of the skin, or changes to the color of your skin and/or veins used for infusion . Rash, itching . Nausea and throwing up (vomiting) . Loose bowel movements (diarrhea) . Ulcers - sores that may cause pain or bleeding in your digestive tract, which includes your mouth, esophagus, stomach, small/large intestines and rectum . Soreness of the mouth and throat. You may have red areas, white patches, or sores that hurt. . Decreased appetite (decreased hunger) . Changes in the tissue of the heart and/or heart attack. Some changes may happen that can cause your heart to have less ability to pump blood. . Bone marrow depression. This is a decrease in the number of white blood cells, red blood cells, and platelets. This may raise your risk of infection, make you tired and weak (fatigue), and raise your risk of bleeding . Sensitivity to light (photosensitivity). Photosensitivity means that you may become more sensitive to the sun and/or light. Your eyes may water more, mostly in bright light. . Allergic reaction: Allergic reactions, including anaphylaxis are rare but may happen in some patients. Signs of allergic reaction to this drug may be swelling of the face, feeling like your tongue or throat are swelling, trouble breathing, rash, itching, fever, chills, feeling dizzy, and/or feeling that your heart is beating in a fast or not normal way. If this happens, do not take another dose  of this drug. You should get urgent medical treatment. . Blurred vision or other changes in eyesight Note: Not all possible side effects are included above.  Warnings and Precautions . Hand-and-foot syndrome. The palms of your hands or soles of your feet may tingle, become numb, painful, swollen, or red. . Changes in your central nervous system  can happen. The central nervous system is made up of your brain and spinal cord. You could feel extreme tiredness, agitation, confusion, hallucinations (see or hear things that are not there), trouble understanding or speaking, loss of control of your bowels or bladder, eyesight changes, numbness or lack of strength to your arms, legs, face, or body, and coma. If you start to have any of these symptoms let your doctor know right away. . Side effects of this drug may be unexpectedly severe in some patients Note: Some of the side effects above are very rare. If you have concerns and/or questions, please discuss them with your medical team.  Important Information . This drug may be present in the saliva, tears, sweat, urine, stool, vomit, semen, and vaginal secretions. Talk to your doctor and/or your nurse about the necessary precautions to take during this time.  Treating Side Effects . To help with hair loss, wash with a mild shampoo and avoid washing your hair every day. . Avoid rubbing your scalp, pat your hair or scalp dry. . Avoid coloring your hair. . Limit your use of hair spray, electric curlers, blow dryers, and curling irons. . If you are interested in getting a wig, talk to your nurse. You can also call the [facility] at 800-ACS-2345 to find out information about the "Look Good, Feel Better" program close to where you live. It is a free program where women getting chemotherapy can learn about wigs, turbans and scarves as well as makeup techniques and skin and nail care. Marland Kitchen Keeping your nails moisturized may help with brittleness. . To help with itching, moisturize your skin several times day. . Avoid sun exposure and apply sunscreen routinely when outdoors. . If you get a rash do not put anything on it unless your doctor or nurse says you may. Keep the area around the rash clean and dry. Ask your doctor for medicine if your rash bothers you. . To help with decreased appetite,  eat small, frequent meals. . Eat high caloric food such as pudding, ice cream, yogurt and milkshakes. . Drink plenty of fluids (a minimum of eight glasses per day is recommended). . If you throw up or have loose bowel movements, you should drink more fluids so that you do not become dehydrated (lack water in the body from losing too much fluid). . To help with nausea and vomiting, eat small, frequent meals instead of three large meals a day. Choose foods and drinks that are at room temperature. Ask your nurse or doctor about other helpful tips and medicine that is available to help or stop lessen these symptoms. . If you get diarrhea, eat low-fiber foods that are high in protein and calories and avoid foods that can irritate your digestive tracts or lead to cramping. . Ask your nurse or doctor about medicine that can lessen or stop your diarrhea. . Mouth care is very important. Your mouth care should consist of routine, gentle cleaning of your teeth or dentures and rinsing your mouth with a mixture of 1/2 teaspoon of salt in 8 ounces of water or  teaspoon of baking soda in 8 ounces of  water. This should be done at least after each meal and at bedtime. . If you have mouth sores, avoid mouthwash that has alcohol. Also avoid alcohol and smoking because they can bother your mouth and throat. . Manage tiredness by pacing your activities for the day. . Be sure to include periods of rest between energy-draining activities. . To help decrease your risk of infections, wash your hands regularly. . Avoid close contact with people who have a cold, the flu, or other infections. . Use a soft toothbrush. Check with your nurse before using dental floss. . Be very careful when using knives or tools. . Use an electric shaver instead of a razor.  Food and Drug Interactions . There are no known interactions of fluorouracil with food. . Check with your doctor or pharmacist about all other prescription medicines and  dietary supplements you are taking before starting this medicine as there are a lot of known drug interactions with fluorouracil. Also, check with your doctor or pharmacist before starting any new prescription or over-the-counter medicines, or dietary supplement to make sure that there are no interactions.  When to Call the Doctor Call your doctor or nurse if you have any of these symptoms and/or any new or unusual symptoms: . Fever of 100.5 F (38 C) or higher . Chills . Easy bleeding or bruising . Trouble breathing . Feeling dizzy or lightheaded . Feeling that your heart is beating in a fast or not normal way (palpitations) . Chest pain or symptoms of a heart attack. Most heart attacks involve pain in the center of the chest that lasts more than a few minutes. The pain may go away and come back or it can be constant. It can feel like pressure, squeezing, fullness, or pain. Sometimes pain is felt in one or both arms, the back, neck, jaw, or stomach. If any of these symptoms last 2 minutes, call 911. Marland Kitchen Confusion and/or agitation . Hallucinations . Trouble understanding or speaking . Blurry vision or changes in your eyesight . Numbness or lack of strength to your arms, legs, face, or body . Nausea that stops you from eating or drinking and/or is not relieved by prescribed medicines . Throwing up more than 3 times a day . Loose bowel movements (diarrhea) 4 times a day or loose bowel movements with lack of strength or a feeling of being dizzy . Lasting loss of appetite or rapid weight loss of five pounds in a week . Pain in your mouth or throat that makes it hard to eat or drink . Pain along the digestive tract - especially if worse after eating . Blood in your vomit (bright red or coffee-ground) and/or stools (bright red, or black/tarry) . Coughing up blood . Fatigue that interferes with your daily activities . Painful, red, or swollen areas on your hands or feet . Numbness and/or tingling of  your hands and/or feet . Signs of allergic reaction: swelling of the face, feeling like your tongue or throat are swelling, trouble breathing, rash, itching, fever, chills, feeling dizzy, and/or feeling that your heart is beating in a fast or not normal way . If you think you are pregnant or may have impregnated your partner  Reproduction Warnings . Pregnancy warning: This drug may have harmful effects on the unborn baby. Women of child bearing potential should use effective methods of birth control during your cancer treatment. Let your doctor know right away if you think you may be pregnant. . Breastfeeding warning: It is  not known if this drug passes into breast milk. For this reason, women should talk to their doctor about the risks and benefits of breast feeding during treatment with this drug because this drug may enter the breast milk and cause harm to a breast feeding baby. . Fertility warning: In men and women both, this drug may affect your ability to have children in the future. Talk with your doctor or nurse if you plan to have children. Ask for information on sperm or egg banking.  Leucovorin Calcium (Generic Name) Other Names: Leucovorin, Wellcovorin, Folinic Acid, Citrovorum Factor  About This Drug Leucovorin is a vitamin. It is used in combination with other cancer fighting drugs such as 5-fluorouracil and methotrexate. Leucovorin helps 5-fluorouracil kill the cancer cells. It also helps to reduce the side effects of methotrexate. Leucovorin is given in the vein (IV) or by mouth (orally).  Will take 2 hours to infuse.  Possible Side Effects . Rash . Wheezing Leucovorin by itself has very few side effects. Side effects you may have can be caused by the other drugs you are taking, such as 5-fluorouracil or methotrexate.  Allergic Reactions . Allergic reactions to this drug are rare. While you are getting this drug by in your vein (IV), please tell your nurse right away if you  have any of these symptoms of an allergic reaction: . Trouble catching your breath . Feeling like your tongue or throat are swelling . Feeling your heart beat quickly or in a not normal way (palpitations) . Feeling dizzy or lightheaded . Flushing, itching, rash, and/or hives . If you are taking the oral form of leucovorin and you have these symptoms, do not take another dose of this drug and get urgent medical treatment.  Treating Side Effects If you get a rash do not put anything on it unless your doctor or nurse says you may. Keep the area around the rash clean and dry. Ask your doctor for medicine if your rash bothers you.  Food and Drug Interactions There are no known interactions of leucovorin with food. This drug may interact with other medicines. Tell your doctor and pharmacist about all the medicines and dietary supplements (vitamins, minerals, herbs and others) that you are taking at this time. The safety and use of dietary supplements and alternative diets are often not known. Using these might affect your cancer or interfere with your treatment. Until more is known, you should not use dietary supplements or alternative diets without your cancer doctor's help.  When to Call the Doctor . Call your doctor or nurse right away if you have any of these symptoms: . Wheezing or trouble breathing . Rash with or without itching, redness, or hives . If you take leucovorin by mouth, please also call your doctor right away if you have: Marland Kitchen Throwing up (vomiting) . Loose bowel movements (diarrhea)  Reproduction Concerns . Pregnancy warning: It is not known if this drug may harm an unborn child. For this reason, be sure to talk with your doctor if you are pregnant or planning to become pregnant while getting this drug. . Genetic counseling is available for you to talk about the effects of this drug therapy on future pregnancies. Also, a genetic counselor can look at the possible risk of problems in  the unborn baby due to this medicine if an exposure happens during pregnancy. . Breast feeding warning: It is not known if this drug passes into breast milk. For this reason, women should talk to  their doctor about the risks and benefits of breast feeding during treatment with this drug because this drug may enter the breast milk and badly harm a breast feeding baby.   Oxaliplatin (Generic Name) Other Names: EloxatinTM  About This Drug Oxaliplatin is used to treat cancer. It is given in the vein (IV).  Will take 2 hours to infuse and will infuse with the leucovorin.       Possible Side Effects (More Common) . Effects on the nerves are called peripheral neuropathy. You may feel numbness, tingling, or pain in your hands and feet. Cold temperatures can make it worse. Avoid cold drinks with ice. Dress warmly and cover the skin if you go out in the cold. Wear socks or gloves if you have to touch cold objects like cold flooring or items in the refrigerator or freezer. . It may be hard for you to button your clothes, open jars, or walk as usual. The effect on the nerves may get worse with more doses of the drug. These effects get better in some people after the drug is stopped but it does not get better in all people . Bone marrow depression: This is a decrease in the number of white blood cells, red blood cells, and platelets. It may increase your risk for infection, make you tired and weak (fatigue), and raise your risk of bleeding. . Nausea and throwing up (vomiting). These symptoms may happen within a few hours after your treatment and may last up to 72 hours. Medicines are available to stop or lessen these side effects. . Electrolyte changes. Your blood will be checked for electrolyte changes as needed.  Possible Side Effects (Less Common) . Skin and tissue irritation: This may involve redness, pain, warmth, or swelling at the IV site. This happens if the drug leaks out of the vein and into  nearby tissue. . Serious allergic reactions including anaphylaxis are rare. While you are getting this drug in your vein (IV), tell your nurse right away if you have any of these symptoms of an allergic reaction: . Trouble catching your breath. . Feeling like your tongue or throat are swelling. . Feeling your heart beat quickly or in a not normal way (palpitations). . Feeling dizzy or lightheaded. . Flushing, itching, rash, or hives. . Changes in your liver function. Your doctor will check your liver function as needed.  Treating Side Effects . Do not drink cold drinks or use ice cubes in drinks. Drink fluids at room temperature or warmer. Drink through a straw. . Wear gloves to touch cold objects. Be aware that most metals are cold to the touch, especially in winter. Examples are your car door handle and your mail box latch. . Wear warm clothing in cold weather at all times. Cover your mouth and nose with a scarf or a pulldown cap (ski cap) to warm the air that goes to your lungs. . Ask your doctor or nurse about medicine to treat nausea, throwing up, headache, or loose bowel movements. . While you are getting this drug in your vein, tell your nurse right away if you have any pain, redness, or swelling at the site of the IV infusion. . Mouth care is very important. Your mouth care should consist of routine, gentle cleaning of your teeth or dentures and rinsing your mouth with a mixture of 1/2 teaspoon of salt in 8 ounces of water or  teaspoon of baking soda in 8 ounces of water. This should be done at  least after each meal and at bedtime. . Drink 6-8 cups of fluids each day unless your doctor has told you to limit your fluid intake due to some other health problem. A cup is 8 ounces of fluid. If you throw up or have loose bowel movements you should drink more fluids so that you do not become dehydrated (lack water in the body due to losing too much fluid).  Food and Drug Interactions There are  no known interactions of oxaliplatin with food. This drug may interact with other medicines. Tell your doctor and pharmacist about all the medicines and dietary supplements (vitamins, minerals, herbs and others) that you are taking at this time. The safety and use of dietary supplements and alternative diets are often not known. Using these might affect your cancer or interfere with your treatment. Until more is known, you should not use dietary supplements or alternative diets without your cancer doctor's help.  When to Call the Doctor Call your doctor or nurse right away if you have any of these symptoms: . Fever of 100.5 F (38 C) or above . Chills . Easy bruising or bleeding . Wheezing or trouble breathing . Rash or itching . Feeling dizzy or lightheaded . Feeling that your heart is beating in a fast or not normal way (palpitations) . Loose bowel movements (diarrhea) more than 4 times a day or diarrhea with weakness or feeling lightheaded . Blurred vision or other changes in eyesight . Pain when passing urine; blood in urine . Pain in your lower back or side . Feeling confused or agitated . Nausea that stops you from eating or drinking . Throwing up more than 3 times a day . Chest pain or symptoms of a heart attack. Most heart attacks involve pain in the center of the chest that lasts more than a few minutes. The pain may go away and come back. It can feel like pressure, squeezing, fullness, or pain. Sometimes pain is felt in one or both arms, the back, neck, jaw, or stomach. If any of these symptoms last 2 minutes, call 911. Marland Kitchen Symptoms of a stroke such as sudden numbness or weakness of your face, arm, or leg, mostly on one side of your body; sudden confusion, trouble speaking or understanding; sudden trouble seeing in one or both eyes; sudden trouble walking, feeling dizzy, loss of balance or coordination; or sudden, bad headache with no known cause. If you have any of these symptoms for 2  minutes, call 911. . Signs of liver problems: dark urine, pale bowel movements, bad stomach pain, feeling very tired and weak, itching, or yellowing of the eyes or skin. Call your doctor or nurse as soon as possible if any of these symptoms happen: . Change in hearing, ringing in the ears . Decreased urine . Unusual thirst or passing urine often . Pain in your mouth or throat that makes it hard to eat or drink . Nausea that is not relieved by prescribed medicines . Rash that is not relieved by prescribed medicines . Heavy menstrual period that lasts longer than normal . Numbness, tingling, decreased feeling or weakness in fingers, toes, arms, or legs . Trouble walking or changes in the way you walk, feeling clumsy when buttoning clothes, opening jars, or other routine hand motions . Swelling of legs, ankles, or feet . Weight gain of 5 pounds in one week (fluid retention) . Lasting loss of appetite or rapid weight loss of five pounds in a week . Fatigue that  interferes with your daily activities . Headache that does not go away . Painful, red, or swollen areas on your hands or feet. . No bowel movement for 3 days or you feel uncomfortable . Extreme weakness that interferes with normal activities  Reproduction Concerns Infertility Warning: Sexual problems and reproduction concerns may happen. In both men and women, this drug may affect your ability to have children. This cannot be determined before your treatment. Talk with your doctor or nurse if you plan to have children. Ask for information on sperm or egg banking. In men, this drug may interfere with your ability to make sperm, but it should not change your ability to have sexual relations. In women, menstrual bleeding may become irregular or stop while you are getting this drug. Do not assume that you cannot become pregnant if you do not have a menstrual period. Women may go through signs of menopause (change of life) like vaginal dryness or  itching. Vaginal lubricants can be used to lessen vaginal dryness, itching, and pain during sexual relations. Genetic counseling is available for you to talk about the effects of this drug therapy on future pregnancies. Also, a genetic counselor can look at the possible risk of problems in the unborn baby due to this medicine if an exposure happens during pregnancy. Pregnancy warning: This drug may have harmful effects on the unborn child, so effective methods of birth control should be used during your cancer treatment. Breast feeding warning: It is not known if this drug passes into breast milk. For this reason, women should talk to their doctor about the risks and benefits of breast feeding during treatment with this drug because this drug may enter the breast milk and badly harm a breast feeding baby.  Bevacizumab (Avastin)  About This Drug Bevacizumab is used to treat cancer. It is given in the vein (IV).  Possible Side Effects . Teary eyes . Runny/stuffy nose . Nosebleed . Changes in the way food and drinks taste . Headache . Back pain . Protein in your urine . Bleeding in your rectum . Dry skin . A red skin rash which can be peeling or scaling . High blood pressure  Note: Each of the side effects above was reported in 10% or greater of patients treated with [id number]. Not all possible side effects are included above.  Warnings and Precautions  . Perforation or fistula- an abnormal hole in your stomach, intestine, esophagus, or other organ, which can be life-threatening . Slow wound healing, which can be life-threatening . Abnormal bleeding which can be life-threatening - symptoms may be coughing up blood, throwing up blood (may look like coffee grounds), red or black tarry bowel movements, abnormally heavy menstrual flow, nosebleeds or any other unusual bleeding. . Blood clots and events such as stroke and heart attack. A blood clot in your leg may cause your leg to  swell, appear red and warm, and/or cause pain. A blood clot in your lungs may cause trouble breathing, pain when breathing, and/or chest pain. . Severe high blood pressure . Changes in your central nervous system can happen. The central nervous system is made up of your brain and spinal cord. You could feel extreme tiredness, agitation, confusion, hallucinations . This drug may interact with other medicines. Tell your doctor and pharmacist about all the medicines and dietary supplements (vitamins, minerals, herbs and others) that you are taking at this time. Also, check with your doctor or pharmacist before starting any new prescription or over-thecounter medicines,  or dietary supplements to make sure that there are no interactions.  When to Call the Doctor Call your doctor or nurse if you have any of these symptoms and/or any new or unusual symptoms:  . Fever of 100.4 F (38 C) or higher . Chills . Confusion and/or agitation . Hallucinations . Trouble understanding or speaking . Headache that does not go away . Nose bleed that doesn't stop bleeding after 10 -15 minutes . Feeling dizzy or lightheaded . Blurry vision or changes in your eyesight . Difficulty swallowing . Easy bleeding or bruising . Blood in your urine, vomit (bright red or coffee-ground) and/or stools ( bright red, or black/tarry) . Coughing up blood . Wheezing and/or trouble breathing . Chest pain or symptoms of a heart attack. Most heart attacks involve pain in the center of the chest that lasts more than a few minutes. The pain may go away and come back. It can feel like pressure, squeezing, fullness, or pain. Sometimes pain is felt in one or both arms, the back, neck, jaw, or stomach. If any of these symptoms last 2 minutes, call 911. Marland Kitchen Symptoms of a stroke such as sudden numbness or weakness of your face, arm, or leg, mostly on one side of your body; sudden confusion, trouble speaking or understanding; sudden  trouble seeing in one or both eyes; sudden trouble walking, feeling dizzy, loss of balance or coordination; or sudden, bad headache with no known cause. If you have any of these symptoms for 2 minutes, call 911. . Numbness or lack of strength to your arms, legs, face, or body . Nausea that stops you from eating or drinking and/or relieved by prescribed medicine . Throwing up more than 3 times a day . Pain in your abdomen that does not go away . Foamy or bubbly-looking urine . Signs of infusion reaction: fever or shaking chills, flushing, facial swelling, feeling dizzy, headache, trouble breathing, rash, itching, chest tightness, or chest pain. . Pain that does not go away or is not relieved by prescribed medicine . Your leg or arm is swollen, red, warm and/or painful . Swelling of arms, hands, legs and/or feet . Weight gain of 5 pounds in one week (fluid retention) . If you think you may be pregnant  Reproduction Warnings . Pregnancy warning: This drug can have harmful effects on the unborn baby. Women of child bearing potential should use effective methods of birth control during your cancer treatment and for 6 months after treatment. In women, changes in your ovaries may happen that may cause menstrual bleeding to become irregular or stop, do not assume you cannot get pregnant. Let your doctor know right away if you think you may be pregnant . Breastfeeding warning: Women should not breastfeed during treatment and for 6 months after treatment because this drug could enter the breast milk and cause harm to a breastfeeding baby. . Fertility warning: In women, this drug may affect your ability to have children in the future. Talk with your doctor or nurse if you plan to have children. Ask for information on egg banking.     SELF CARE ACTIVITIES WHILE RECEIVING CHEMOTHERAPY:  Hydration Increase your fluid intake 48 hours prior to treatment and drink at least 8 to 12 cups (64 ounces)  of water/decaffeinated beverages per day after treatment. You can still have your cup of coffee or soda but these beverages do not count as part of your 8 to 12 cups that you need to drink daily. No alcohol  intake.  Medications Continue taking your normal prescription medication as prescribed.  If you start any new herbal or new supplements please let us know first to make sure it is safe.  Mouth Care Have teeth cleaned professionally before starting treatment. Keep dentures and partial plates clean. Use soft toothbrush and do not use mouthwashes that contain alcohol. Biotene is a good mouthwash that is available at most pharmacies or may be ordered by calling [phone]. Use warm salt water gargles (1 teaspoon salt per 1 quart warm water) before and after meals and at bedtime. If you need dental work, please let the doctor know before you go for your appointment so that we can coordinate the best possible time for you in regards to your chemo regimen. You need to also let your dentist know that you are actively taking chemo. We may need to do labs prior to your dental appointment.  Skin Care Always use sunscreen that has not expired and with SPF (Sun Protection Factor) of 50 or higher. Wear hats to protect your head from the sun. Remember to use sunscreen on your hands, ears, face, & feet.  Use good moisturizing lotions such as udder cream, eucerin, or even Vaseline. Some chemotherapies can cause dry skin, color changes in your skin and nails.    . Avoid long, hot showers or baths. . Use gentle, fragrance-free soaps and laundry detergent. . Use moisturizers, preferably creams or ointments rather than lotions because the thicker consistency is better at preventing skin dehydration. Apply the cream or ointment within 15 minutes of showering. Reapply moisturizer at night, and moisturize your hands every time after you wash them.  Hair Loss (if your doctor says your hair will fall out)  . If your  doctor says that your hair is likely to fall out, decide before you begin chemo whether you want to wear a wig. You may want to shop before treatment to match your hair color. . Hats, turbans, and scarves can also camouflage hair loss, although some people prefer to leave their heads uncovered. If you go bare-headed outdoors, be sure to use sunscreen on your scalp. . Cut your hair short. It eases the inconvenience of shedding lots of hair, but it also can reduce the emotional impact of watching your hair fall out. . Don't perm or color your hair during chemotherapy. Those chemical treatments are already damaging to hair and can enhance hair loss. Once your chemo treatments are done and your hair has grown back, it's OK to resume dyeing or perming hair.  With chemotherapy, hair loss is almost always temporary. But when it grows back, it may be a different color or texture. In older adults who still had hair color before chemotherapy, the new growth may be completely gray.  Often, new hair is very fine and soft.  Infection Prevention Please wash your hands for at least 30 seconds using warm soapy water. Handwashing is the #1 way to prevent the spread of germs. Stay away from sick people or people who are getting over a cold. If you develop respiratory systems such as green/yellow mucus production or productive cough or persistent cough let us know and we will see if you need an antibiotic. It is a good idea to keep a pair of gloves on when going into grocery stores/[employer] to decrease your risk of coming into contact with germs on the carts, etc. Carry alcohol hand gel with you at all times and use it frequently if out in  public. If your temperature reaches 100.4 or higher please call the clinic and let us know.  If it is after hours or on the weekend please go to the ER if your temperature is over 100.4.  Please have your own personal thermometer at home to use.    Sex and bodily fluids If you are going to  have sex, a condom must be used to protect the person that isn't taking chemotherapy. Chemo can decrease your libido (sex drive). For a few days after chemotherapy, chemotherapy can be excreted through your bodily fluids.  When using the toilet please close the lid and flush the toilet twice.  Do this for a few day after you have had chemotherapy.   Effects of chemotherapy on your sex life Some changes are simple and won't last long. They won't affect your sex life permanently.  Sometimes you may feel: . too tired . not strong enough to be very active . sick or sore  . not in the mood . anxious or low  Your anxiety might not seem related to sex. For example, you may be worried about the cancer and how your treatment is going. Or you may be worried about money, or about how you family are coping with your illness.  These things can cause stress, which can affect your interest in sex. It's important to talk to your partner about how you feel.  Remember - the changes to your sex life don't usually last long. There's usually no medical reason to stop having sex during chemo. The drugs won't have any long term physical effects on your performance or enjoyment of sex. Cancer can't be passed on to your partner during sex  Contraception It's important to use reliable contraception during treatment. Avoid getting pregnant while you or your partner are having chemotherapy. This is because the drugs may harm the baby. Sometimes chemotherapy drugs can leave a man or woman infertile.  This means you would not be able to have children in the future. You might want to talk to someone about permanent infertility. It can be very difficult to learn that you may no longer be able to have children. Some people find counselling helpful. There might be ways to preserve your fertility, although this is easier for men than for women. You may want to speak to a fertility expert. You can talk about sperm banking or harvesting  your eggs. You can also ask about other fertility options, such as donor eggs. If you have or have had breast cancer, your doctor might advise you not to take the contraceptive pill. This is because the hormones in it might affect the cancer. It is not known for sure whether or not chemotherapy drugs can be passed on through semen or secretions from the vagina. Because of this some doctors advise people to use a barrier method if you have sex during treatment. This applies to vaginal, anal or oral sex. Generally, doctors advise a barrier method only for the time you are actually having the treatment and for about a week after your treatment. Advice like this can be worrying, but this does not mean that you have to avoid being intimate with your partner. You can still have close contact with your partner and continue to enjoy sex.  Animals If you have cats or birds we just ask that you not change the litter or change the cage.  Please have someone else do this for you while you are on chemotherapy.  Food Safety During and After Cancer Treatment Food safety is important for people both during and after cancer treatment. Cancer and cancer treatments, such as chemotherapy, radiation therapy, and stem cell/bone marrow transplantation, often weaken the immune system. This makes it harder for your body to protect itself from foodborne illness, also called food poisoning. Foodborne illness is caused by eating food that contains harmful bacteria, parasites, or viruses.  Foods to avoid Some foods have a higher risk of becoming tainted with bacteria. These include: Marland Kitchen Unwashed fresh fruit and vegetables, especially leafy vegetables that can hide dirt and other contaminants . Raw sprouts, such as alfalfa sprouts . Raw or undercooked beef, especially ground beef, or other raw or undercooked meat and poultry . Fatty, fried, or spicy foods immediately before or after treatment.  These can sit heavy on your stomach and  make you feel nauseous. . Raw or undercooked shellfish, such as oysters. . Sushi and sashimi, which often contain raw fish.  . Unpasteurized beverages, such as unpasteurized fruit juices, raw milk, raw yogurt, or cider . Undercooked eggs, such as soft boiled, over easy, and poached; raw, unpasteurized eggs; or foods made with raw egg, such as homemade raw cookie dough and homemade mayonnaise  Simple steps for food safety  Shop smart. . Do not buy food stored or displayed in an unclean area. . Do not buy bruised or damaged fruits or vegetables. . Do not buy cans that have cracks, dents, or bulges. . Pick up foods that can spoil at the end of your shopping trip and store them in a cooler on the way home.  Prepare and clean up foods carefully. . Rinse all fresh fruits and vegetables under running water, and dry them with a clean towel or paper towel. . Clean the top of cans before opening them. . After preparing food, wash your hands for 20 seconds with hot water and soap. Pay special attention to areas between fingers and under nails. . Clean your utensils and dishes with hot water and soap. Marland Kitchen Disinfect your kitchen and cutting boards using 1 teaspoon of liquid, unscented bleach mixed into 1 quart of water.    Dispose of old food. . Eat canned and packaged food before its expiration date (the "use by" or "best before" date). . Consume refrigerated leftovers within 3 to 4 days. After that time, throw out the food. Even if the food does not smell or look spoiled, it still may be unsafe. Some bacteria, such as Listeria, can grow even on foods stored in the refrigerator if they are kept for too long.  Take precautions when eating out. . At restaurants, avoid buffets and salad bars where food sits out for a long time and comes in contact with many people. Food can become contaminated when someone with a virus, often a norovirus, or another "bug" handles it. . Put any leftover food in a "to-go"  container yourself, rather than having the server do it. And, refrigerate leftovers as soon as you get home. . Choose restaurants that are clean and that are willing to prepare your food as you order it cooked.   AT HOME MEDICATIONS:  Compazine/Prochlorperazine 10mg  tablet. Take 1 tablet every 6 hours as needed for nausea/vomiting. (This can make you sleepy)   EMLA cream. Apply a quarter size amount to port site 1 hour prior to chemo. Do not rub in. Cover with plastic wrap.    Diarrhea Sheet   If you are having loose stools/diarrhea, please purchase Imodium and begin taking as outlined:  At the first sign of poorly formed or loose stools you should begin taking Imodium (loperamide) 2 mg capsules.  Take two tablets (4mg ) followed by one tablet (2mg ) every 2 hours - DO NOT EXCEED 8 tablets in 24 hours.  If it is bedtime and you are having loose stools, take 2 tablets at bedtime, then 2 tablets every 4 hours until morning.   Always call the [facility] if you are having loose stools/diarrhea that you can't get under control.  Loose stools/diarrhea leads to dehydration (loss of water) in your body.  We have other options of trying to get the loose stools/diarrhea to stop but you must let us know!   Constipation Sheet  Colace - 100 mg capsules - take 2 capsules daily.  If this doesn't help then you can increase to 2 capsules twice daily.  Please call if the above does not work for you. Do not go more than 2 days without a bowel movement.  It is very important that you do not become constipated.  It will make you feel sick to your stomach (nausea) and can cause abdominal pain and vomiting.  Nausea Sheet   Compazine/Prochlorperazine 10mg  tablet. Take 1 tablet every 6 hours as needed for nausea/vomiting (This can make you drowsy).  If you  are having persistent nausea (nausea that does not stop) please call the [facility] and let us know the amount of nausea that you are experiencing.  If you begin to vomit, you need to call the [facility] and if it is the weekend and you have vomited more than one time and can't get it to stop-go to the Emergency Room.  Persistent nausea/vomiting can lead to dehydration (loss of fluid in your body) and will make you feel very weak and unwell. Ice chips, sips of clear liquids, foods that are at room temperature, crackers, and toast tend to be better tolerated.   SYMPTOMS TO REPORT AS SOON AS POSSIBLE AFTER TREATMENT:  FEVER GREATER THAN 100.4 F  CHILLS WITH OR WITHOUT FEVER  NAUSEA AND VOMITING THAT IS NOT CONTROLLED WITH YOUR NAUSEA MEDICATION  UNUSUAL SHORTNESS OF BREATH  UNUSUAL BRUISING OR BLEEDING  TENDERNESS IN MOUTH AND THROAT WITH OR WITHOUT PRESENCE OF ULCERS  URINARY PROBLEMS  BOWEL PROBLEMS  UNUSUAL RASH      Wear comfortable clothing and clothing appropriate for easy access to any Portacath or PICC line. Let us know if there is anything that we can do to make your therapy better!    What to do if you need assistance after hours or on the weekends: CALL [phone].  HOLD on the line, do not hang up.  You will hear multiple messages but at the end you will be connected with a nurse triage line.  They will contact the doctor if necessary.  Most of the time they will be able to assist you.  Do not call the hospital operator.      I have been informed and understand all of the instructions given to me and have received a copy. I have been instructed to call the clinic [phone]  [phone] or my family physician as soon as possible for continued medical care, if indicated. I do not have any more questions at this time but understand that I may call the [facility] or the Patient Navigator at [phone] during office hours should I have questions or need assistance in  obtaining follow-up care.

## 2020-03-21 NOTE — Telephone Encounter (Signed)
Patient requested some help with paying for her medications.  We have sent them to [facility] and have paid the cost of those for her.  The prescriptions sent to [facility] were cancelled.  Patient's sister is aware.

## 2020-03-21 NOTE — Progress Notes (Addendum)
[facility] Initial Psychosocial Assessment Clinical Social Work  Clinical Social Work spoke w sister in person and contacted her by phone to assess psychosocial, emotional, mental health, and spiritual needs of the patient.   Barriers to care/review of distress screen:  - Transportation:  Do you anticipate any problems getting to appointments?  Do you have someone who can help run errands for you if you need it?  Sister can transport.   - Help at home:  What is your living situation (alone, family, other)?  If you are physically unable to care for yourself, who would you call on to help you?  Lives w sister and her sons (30 and 3) and 108 yo daughter at this point.  Per sister, "they love having her around."  Patient has taken care of sister Ronny Bacon when she herself had cancer. "She came, dropped everything, made sure my kids were taken care of, cooked/cleaned."  Sister has bought hospital bed, recliner, wheelchair (old - from father).  Does need proper wheelchair w foot rests.  Sister and children help patient get in/out of bed and wheel her around the house in wheelchair.  Patient has son w disability, patient was taking care of him prior to last hospitalization. Now patient's mother is watching over son, has taken over the caregiving responsibilities.   - Support system:  What does your support system look like?  Who would you call on if you needed some kind of practical help?  What if you needed someone to talk to for emotional support? Sister and her family.  Patient also has daughter ([place]), son Nicole Kindred - disabled but comes and sits w her).  Patients twin brother also helps w transport - his car is more accessible.  Other family members are active in caring for patient and caregiver - "everybody is trying to pitch in, Im not doing it by myself."  "I am trusting God for a miracle." - Finances:  Are you concerned about finances.  Considering returning to work?  If not, applying for disability?  Applied  for Medicaid approx one year ago - "she fell and hurt her shoulder, still had not found tumor at that time."  Has appeal hearing for Medicaid in early June.  Last time patient worked was approx 1.5 years ago - was laid off and has not been called back.  Shortly after being laid off, health declined.  Gets Food Stamps but has no other income at this point.  Worked in [employer] fund[employer].    What is your understanding of where you are with your cancer? Its cause?  Your treatment plan and what happens next?  Newly diagnosed w Stage IV colon cancer, but "it has been over a year since she has not been feeling well."  Started w a fall - "she began to just get dizzy and fell."  Has been told "she may have had the tumor for years."  Had surgery to repair damage to her shoulder post fall.  Wonders "how did we miss this?" "We are a faith based family, we believe the doctors but we also know what God can do."  Sister wants patient to participate in her healing - using greens, apricot seeds, lime water to make system alkaline.  Gives plant based protein drinks also.  Wants patient to "tell us when she doesn't feel good."  Wants patient to eat/drink as much as possible in order to regain health.  Starts chemotherapy next week.    What are your worries  for the future as you begin treatment for cancer?  No concerns, "I just pray she will be strong enough to do it."  Aware they will "try it and see."    What are your hopes and priorities during your treatment? What is important to you? What are your goals for your care?  "She does not like pain, she does not like to hurt in any way/shape/form."  Needs to just "get through this process and be given more time here."    CSW Summary:  Patient and family psychosocial functioning including strengths, limitations, and coping skills:  54 year old female, newly diagnosed w Stage IV cancer after approx one year of poor health.  Has two children - one disabled.  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  Patient now lives w sister and her family who provide essentially total care for patient. Patient is unable to walk, transfer, toilet or shower without significant assistance.  Has a supportive, close family who are actively engaged in providing significant help to patient.  Family has a strong faith background and are prayerfully waiting for a miracle, they do understand the gravity of the patient's physical issues but are trying to maintain a positive and hopeful attitude.   Identifications of barriers to care:  Uninsured, no income.  Needs proper wheelchair.  Pads for her to sit on.   Availability of community resources:  Referred to [facility] for caregiver support, [facility] Social Worker follow up needed: Yes.   Will call in two weeks to assess progress.  Edwyna Shell, LCSW Clinical Social Worker Phone:  [phone] Cell:  [phone]

## 2020-03-22 ENCOUNTER — Encounter: Payer: Self-pay | Admitting: General Surgery

## 2020-03-22 ENCOUNTER — Encounter (HOSPITAL_COMMUNITY): Payer: Self-pay

## 2020-03-22 LAB — SARS CORONAVIRUS 2 (TAT 6-24 HRS): SARS Coronavirus 2: NEGATIVE

## 2020-03-23 ENCOUNTER — Ambulatory Visit (HOSPITAL_COMMUNITY): Payer: Medicaid Other

## 2020-03-23 ENCOUNTER — Inpatient Hospital Stay (HOSPITAL_COMMUNITY): Payer: Self-pay

## 2020-03-23 ENCOUNTER — Ambulatory Visit (HOSPITAL_COMMUNITY): Payer: Medicaid Other | Admitting: Anesthesiology

## 2020-03-23 ENCOUNTER — Ambulatory Visit (HOSPITAL_COMMUNITY)
Admission: RE | Admit: 2020-03-23 | Discharge: 2020-03-23 | Disposition: A | Discharge status: Home / Self Care | Payer: Medicaid Other | Attending: General Surgery | Admitting: General Surgery

## 2020-03-23 ENCOUNTER — Encounter (HOSPITAL_COMMUNITY)
Admission: RE | Disposition: A | Discharge status: Home / Self Care | Payer: Self-pay | Source: Home / Self Care | Attending: General Surgery

## 2020-03-23 ENCOUNTER — Encounter (HOSPITAL_COMMUNITY): Payer: Self-pay | Admitting: General Surgery

## 2020-03-23 ENCOUNTER — Ambulatory Visit (HOSPITAL_COMMUNITY): Payer: Self-pay | Admitting: Hematology

## 2020-03-23 DIAGNOSIS — Z800 Family history of malignant neoplasm of digestive organs: Secondary | ICD-10-CM | POA: Diagnosis not present

## 2020-03-23 DIAGNOSIS — C189 Malignant neoplasm of colon, unspecified: Secondary | ICD-10-CM | POA: Insufficient documentation

## 2020-03-23 DIAGNOSIS — Z79899 Other long term (current) drug therapy: Secondary | ICD-10-CM | POA: Diagnosis not present

## 2020-03-23 DIAGNOSIS — C787 Secondary malignant neoplasm of liver and intrahepatic bile duct: Secondary | ICD-10-CM | POA: Insufficient documentation

## 2020-03-23 DIAGNOSIS — F17210 Nicotine dependence, cigarettes, uncomplicated: Secondary | ICD-10-CM | POA: Insufficient documentation

## 2020-03-23 DIAGNOSIS — C183 Malignant neoplasm of hepatic flexure: Secondary | ICD-10-CM | POA: Diagnosis present

## 2020-03-23 DIAGNOSIS — Z95828 Presence of other vascular implants and grafts: Secondary | ICD-10-CM

## 2020-03-23 DIAGNOSIS — R188 Other ascites: Secondary | ICD-10-CM | POA: Diagnosis not present

## 2020-03-23 DIAGNOSIS — K219 Gastro-esophageal reflux disease without esophagitis: Secondary | ICD-10-CM | POA: Insufficient documentation

## 2020-03-23 HISTORY — PX: PORTACATH PLACEMENT: SHX2246

## 2020-03-23 SURGERY — INSERTION, TUNNELED CENTRAL VENOUS DEVICE, WITH PORT
Anesthesia: General | Site: Chest | Laterality: Right

## 2020-03-23 MED ORDER — ONDANSETRON HCL 4 MG/2ML IJ SOLN: 4.0000 mg | Freq: Once | INTRAMUSCULAR | Status: DC | PRN

## 2020-03-23 MED ORDER — HYDROMORPHONE HCL 1 MG/ML IJ SOLN: 0.2500 mg | INTRAMUSCULAR | Status: DC | PRN

## 2020-03-23 MED ORDER — CEFAZOLIN SODIUM-DEXTROSE 2-4 GM/100ML-% IV SOLN
2.0000 g | INTRAVENOUS | Status: AC
  Administered 2020-03-23: 2 g via INTRAVENOUS

## 2020-03-23 MED ORDER — PROPOFOL 10 MG/ML IV BOLUS
INTRAVENOUS | Status: AC
  Filled 2020-03-23: qty 60

## 2020-03-23 MED ORDER — CEFAZOLIN SODIUM-DEXTROSE 2-4 GM/100ML-% IV SOLN
INTRAVENOUS | Status: AC
  Filled 2020-03-23: qty 100

## 2020-03-23 MED ORDER — LIDOCAINE HCL (PF) 1 % IJ SOLN
INTRAMUSCULAR | Status: AC
  Filled 2020-03-23: qty 30

## 2020-03-23 MED ORDER — KETAMINE HCL 50 MG/5ML IJ SOSY
PREFILLED_SYRINGE | INTRAMUSCULAR | Status: AC
  Filled 2020-03-23: qty 5

## 2020-03-23 MED ORDER — OXYCODONE HCL 5 MG PO TABS: 5.0000 mg | ORAL_TABLET | Freq: Four times a day (QID) | ORAL | 0 refills | Status: AC | PRN

## 2020-03-23 MED ORDER — LACTATED RINGERS IV SOLN: Freq: Once | INTRAVENOUS | Status: DC

## 2020-03-23 MED ORDER — CHLORHEXIDINE GLUCONATE 0.12 % MT SOLN: 15.0000 mL | Freq: Once | OROMUCOSAL | Status: DC

## 2020-03-23 MED ORDER — CHLORHEXIDINE GLUCONATE CLOTH 2 % EX PADS: 6.0000 | MEDICATED_PAD | Freq: Once | CUTANEOUS | Status: DC

## 2020-03-23 MED ORDER — LIDOCAINE HCL (PF) 1 % IJ SOLN
INTRAMUSCULAR | Status: AC
  Filled 2020-03-23: qty 2

## 2020-03-23 MED ORDER — ORAL CARE MOUTH RINSE: 15.0000 mL | Freq: Once | OROMUCOSAL | Status: DC

## 2020-03-23 MED ORDER — HEPARIN SOD (PORK) LOCK FLUSH 100 UNIT/ML IV SOLN
INTRAVENOUS | Status: AC
  Filled 2020-03-23: qty 5

## 2020-03-23 MED ORDER — LIDOCAINE HCL (PF) 1 % IJ SOLN
INTRAMUSCULAR | Status: DC | PRN
  Administered 2020-03-23: 2 mL
  Administered 2020-03-23: 8 mL

## 2020-03-23 MED ORDER — SODIUM CHLORIDE (PF) 0.9 % IJ SOLN
INTRAMUSCULAR | Status: DC | PRN
  Administered 2020-03-23: 500 mL

## 2020-03-23 MED ORDER — MEPERIDINE HCL 50 MG/ML IJ SOLN: 6.2500 mg | INTRAMUSCULAR | Status: DC | PRN

## 2020-03-23 MED ORDER — HEPARIN SOD (PORK) LOCK FLUSH 100 UNIT/ML IV SOLN
INTRAVENOUS | Status: DC | PRN
  Administered 2020-03-23: 500 [IU] via INTRAVENOUS

## 2020-03-23 MED ORDER — MIDAZOLAM HCL 2 MG/2ML IJ SOLN
INTRAMUSCULAR | Status: AC
  Filled 2020-03-23: qty 2

## 2020-03-23 MED ORDER — LACTATED RINGERS IV SOLN: INTRAVENOUS | Status: DC | PRN

## 2020-03-23 MED ORDER — KETAMINE HCL 10 MG/ML IJ SOLN
INTRAMUSCULAR | Status: DC | PRN
  Administered 2020-03-23: 10 mg via INTRAVENOUS

## 2020-03-23 MED ORDER — OXYCODONE HCL 5 MG PO TABS: 5.0000 mg | ORAL_TABLET | Freq: Four times a day (QID) | ORAL | 0 refills | Status: DC | PRN

## 2020-03-23 MED ORDER — MIDAZOLAM HCL 5 MG/5ML IJ SOLN
INTRAMUSCULAR | Status: DC | PRN
  Administered 2020-03-23: .5 mg via INTRAVENOUS

## 2020-03-23 SURGICAL SUPPLY — 35 items
APPLICATOR CHLORAPREP 10.5 ORG (MISCELLANEOUS) ×3 IMPLANT
BAG DECANTER FOR FLEXI CONT (MISCELLANEOUS) ×3 IMPLANT
CLOTH BEACON ORANGE TIMEOUT ST (SAFETY) ×3 IMPLANT
COVER LIGHT HANDLE STERIS (MISCELLANEOUS) ×6 IMPLANT
COVER WAND RF STERILE (DRAPES) ×3 IMPLANT
DECANTER SPIKE VIAL GLASS SM (MISCELLANEOUS) ×3 IMPLANT
DERMABOND ADVANCED (GAUZE/BANDAGES/DRESSINGS) ×2
DERMABOND ADVANCED .7 DNX12 (GAUZE/BANDAGES/DRESSINGS) ×1 IMPLANT
DRAPE C-ARM FOLDED MOBILE STRL (DRAPES) ×3 IMPLANT
ELECT REM PT RETURN 9FT ADLT (ELECTROSURGICAL) ×3
ELECTRODE REM PT RTRN 9FT ADLT (ELECTROSURGICAL) ×1 IMPLANT
GLOVE BIO SURGEON STRL SZ 6.5 (GLOVE) ×2 IMPLANT
GLOVE BIO SURGEONS STRL SZ 6.5 (GLOVE) ×1
GLOVE BIOGEL PI IND STRL 6.5 (GLOVE) ×1 IMPLANT
GLOVE BIOGEL PI IND STRL 7.0 (GLOVE) ×1 IMPLANT
GLOVE BIOGEL PI INDICATOR 6.5 (GLOVE) ×2
GLOVE BIOGEL PI INDICATOR 7.0 (GLOVE) ×2
GLOVE ECLIPSE 6.5 STRL STRAW (GLOVE) ×3 IMPLANT
GOWN STRL REUS W/TWL LRG LVL3 (GOWN DISPOSABLE) ×6 IMPLANT
IV NS 500ML (IV SOLUTION) ×2
IV NS 500ML BAXH (IV SOLUTION) ×1 IMPLANT
KIT PORT POWER 8FR ISP MRI (Port) ×3 IMPLANT
KIT TURNOVER KIT A (KITS) ×3 IMPLANT
MANIFOLD NEPTUNE II (INSTRUMENTS) ×3 IMPLANT
NEEDLE HYPO 25X1 1.5 SAFETY (NEEDLE) ×3 IMPLANT
PACK MINOR (CUSTOM PROCEDURE TRAY) ×3 IMPLANT
PAD ARMBOARD 7.5X6 YLW CONV (MISCELLANEOUS) ×3 IMPLANT
SET BASIN LINEN APH (SET/KITS/TRAYS/PACK) ×3 IMPLANT
SUT MNCRL AB 4-0 PS2 18 (SUTURE) ×3 IMPLANT
SUT PROLENE 2 0 SH 30 (SUTURE) ×3 IMPLANT
SUT VIC AB 3-0 SH 27 (SUTURE) ×2
SUT VIC AB 3-0 SH 27X BRD (SUTURE) ×1 IMPLANT
SYR 10ML LL (SYRINGE) ×6 IMPLANT
SYR 5ML LL (SYRINGE) ×3 IMPLANT
SYR CONTROL 10ML LL (SYRINGE) ×3 IMPLANT

## 2020-03-23 NOTE — Anesthesia Postprocedure Evaluation (Signed)
Anesthesia Post Note  Patient: Natalie Washington  Procedure(s) Performed: INSERTION PALLATIVE PORT-A-CATH (attached catheter in right subclavian) (Right Chest)  Patient location during evaluation: PACU Anesthesia Type: General Level of consciousness: awake, oriented, awake and alert and patient cooperative Pain management: pain level not controlled Vital Signs Assessment: post-procedure vital signs reviewed and stable Respiratory status: spontaneous breathing and nonlabored ventilation Cardiovascular status: blood pressure returned to baseline and stable Postop Assessment: no headache and no backache Anesthetic complications: no     Last Vitals:  Vitals:   03/23/20 0948 03/23/20 1001  BP: (!) 97/56 (P) 95/82  Pulse: 99 (P) 98  Resp: 20 (!) (P) 22  Temp:  (!) (P) 36 C  SpO2:  (P) 99%    Last Pain:  Vitals:   03/23/20 1001  TempSrc: (P) Rectal  PainSc:                  Tacy Learn

## 2020-03-23 NOTE — Interval H&P Note (Signed)
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  The various methods of treatment have been discussed with the patient and family. After consideration of risks, benefits and other options for treatment, the patient has consented to  Procedure(s): INSERTION PORT-A-CATH (N/A) as a surgical intervention.  The patient's history has been reviewed, patient examined, no change in status, stable for surgery.  I have reviewed the patient's chart and labs.  Questions were answered to the patient's satisfaction.    Patient denies changes. Says she is still eating, drinking, and having Bms. Feels weak and legs feel weak. She is trying to move around as much as she can.   Virl Cagey

## 2020-03-23 NOTE — Progress Notes (Signed)
[facility]  Spoke with Ronny Bacon notified her port in place but had difficulty with IV access prior to port. The patient needed a left external jugular IV.   Told Natasha to have patient drink given some degree of dehydration and limited access.   CXR pending.   Curlene Labrum, MD [facility] [address] [place], [place] [address] [phone] (office)

## 2020-03-23 NOTE — Op Note (Signed)
Operative Note 03/23/20   Preoperative Diagnosis:  Metastatic colon cancer    Postoperative Diagnosis: Same   Procedure(s) Performed: Palliative Port-A-Cath placement, right subclavian    Surgeon: Lanell Matar. Constance Haw, MD   Assistants: No qualified resident was available   Anesthesia: Monitored anesthesia care   Anesthesiologist: Denese Killings, MD    Specimens: None   Estimated Blood Loss: Minimal   Fluoroscopy time: 1 minute 50 seconds   Blood Replacement: None    Complications: None    Operative Findings:  Normal   Indications:  Natalie Washington is a 54 yo with metastatic colon cancer to the liver with innumerous liver lesions who has been offered palliative chemotherapy. She has a near obstructing colon mass and ascites, and has not been clinically obstructed. We discussed the risk of surgery for port placement including bleeding, infection, malfunction, injury to vessels, and pneumothorax, and she has opted to proceed with a palliative port for palliative chemotherapy. There was very difficult IV access preoperatively and she required a left external jugular IV to be placed. Given this location, we opted for placement of the port on the right side.   Procedure: The patient was brought into the operating room and monitored anesthesia care was induced.  One percent lidocaine was used for local anesthesia.   The right chest and neck was prepped and draped in the usual sterile fashion.  Preoperative antibiotics were given.  An incision was made below the rigth clavicle. A subcutaneous pocket was formed. The needles advanced into the right subclavian vein using the Seldinger technique without difficulty. A guidewire was then advanced into the right atrium under fluoroscopic guidance.  Ectopia was not noted. An introducer and peel-away sheath were placed over the guidewire. A spot film was performed to confirm the position as I placed the sheath.  The wire was down into the vein cava but  the sheath was wanting to go to the left side. Under fluoroscopic guidance, I pulled back the wire and sheath, and was able to get the sheath to follow the guide wire instead of tenting the wire to the left. The catheter was then inserted through the peel-away sheath and the peel-away sheath was removed.   The catheter was then attached to the port and the port placed in subcutaneous pocket. Adequate positioning was confirmed by fluoroscopy. Hemostasis was confirmed, and the port was secured with 2-0 prolene sutures.  Good backflow of blood was noted on aspiration of the port. The port was flushed with heparin flush. Subcutaneous layer was reapproximated using a 3-0 Vicryl interrupted suture. The skin was closed using a 4-0 Vicryl subcuticular suture. Dermabond was applied.    All tape and needle counts were correct at the end of the procedure. The patient was transferred to PACU in stable condition. A chest x-ray will be performed at that time.  Curlene Labrum, MD [facility] [address] [place], [place] [address] [phone] (office)

## 2020-03-23 NOTE — Anesthesia Preprocedure Evaluation (Addendum)
Anesthesia Evaluation  Patient identified by MRN, date of birth, ID band Patient awake    Reviewed: Allergy & Precautions, NPO status , Patient's Chart, lab work & pertinent test results  Airway Mallampati: II  TM Distance: >3 FB Neck ROM: Full    Dental  (+) Missing, Dental Advisory Given   Pulmonary pneumonia, Current Smoker and Patient abstained from smoking.,    Pulmonary exam normal breath sounds clear to auscultation       Cardiovascular Exercise Tolerance: Good Normal cardiovascular exam Rhythm:Regular Rate:Normal     Neuro/Psych negative neurological ROS  negative psych ROS   GI/Hepatic Neg liver ROS, GERD  Medicated and Controlled,  Endo/Other  negative endocrine ROS  Renal/GU negative Renal ROS  negative genitourinary   Musculoskeletal negative musculoskeletal ROS (+)   Abdominal   Peds  Hematology negative hematology ROS (+) anemia ,   Anesthesia Other Findings Colon cancer   Reproductive/Obstetrics negative OB ROS                             Anesthesia Physical  Anesthesia Plan  ASA: IV  Anesthesia Plan: General   Post-op Pain Management:    Induction: Intravenous  PONV Risk Score and Plan: 3  Airway Management Planned: Nasal Cannula, Natural Airway and Simple Face Mask  Additional Equipment:   Intra-op Plan:   Post-operative Plan:   Informed Consent: I have reviewed the patients History and Physical, chart, labs and discussed the procedure including the risks, benefits and alternatives for the proposed anesthesia with the patient or authorized representative who has indicated his/her understanding and acceptance.     Dental advisory given  Plan Discussed with: CRNA and Surgeon  Anesthesia Plan Comments:       Anesthesia Quick Evaluation

## 2020-03-23 NOTE — Transfer of Care (Signed)
Immediate Anesthesia Transfer of Care Note  Patient: Natalie Washington  Procedure(s) Performed: INSERTION PALLATIVE PORT-A-CATH (attached catheter in right subclavian) (Right Chest)  Patient Location: PACU  Anesthesia Type:MAC  Level of Consciousness: awake, alert , oriented, patient cooperative and responds to stimulation  Airway & Oxygen Therapy: Patient Spontanous Breathing and Patient connected to nasal cannula oxygen  Post-op Assessment: Report given to RN, Post -op Vital signs reviewed and stable, Patient moving all extremities and Patient moving all extremities X 4  Post vital signs: Reviewed and stable  Last Vitals:  Vitals Value Taken Time  BP 104/75 03/23/20 1149  Temp    Pulse    Resp 12 03/23/20 1153  SpO2    Vitals shown include unvalidated device data.  Last Pain:  Vitals:   03/23/20 1001  TempSrc: (P) Rectal  PainSc:          Complications: No apparent anesthesia complications

## 2020-03-23 NOTE — H&P (Signed)
[facility] History and Physical  Reason for Referral: Port placement  Referring Physician: Dr. Delton Coombes     Chief Complaint     Port a cath placement      Natalie Washington is a 54 y.o. female.  HPI: Natalie Washington is a 54 yo with metastatic colon cancer to the liver and near obstructing ascending colon mass. She is currently still eating and drinking, and is having regular Bms. I met her in the hospital and she had wanted to be discharged to discuss her options with her family. She has decided to undergo palliative chemotherapy. We have been asked to place a port.      Past Medical History:  Diagnosis Date  . Acid reflux   . Pneumonia    1980        Past Surgical History:  Procedure Laterality Date  . BIOPSY  03/09/2020   Procedure: BIOPSY; Surgeon: Daneil Dolin, MD; Location: [facility]; Service: Endoscopy;;  . COLONOSCOPY WITH PROPOFOL N/A 03/09/2020   Procedure: COLONOSCOPY WITH PROPOFOL; Surgeon: Daneil Dolin, MD; Location: [facility]; Service: Endoscopy; Laterality: N/A;  . ORIF HUMERUS FRACTURE Right 04/07/2019   Procedure: OPEN REDUCTION INTERNAL FIXATION (ORIF) PROXIMAL HUMERUS FRACTURE; BICEP TENODESIS; Surgeon: Hiram Gash, MD; Location: [facility]; Service: Orthopedics; Laterality: Right;  . POLYPECTOMY  03/09/2020   Procedure: POLYPECTOMY; Surgeon: Daneil Dolin, MD; Location: [facility]; Service: Endoscopy;;  . WISDOM TOOTH EXTRACTION          Family History  Problem Relation Age of Onset  . Colon cancer Father    unsure age of onset. Passed away in Jan 12, 2005   Social History        Tobacco Use  . Smoking status: Current Every Day Smoker    Packs/day: 0.25    Years: 15.00    Pack years: 3.75  . Smokeless tobacco: Never Used  Substance Use Topics  . Alcohol use: Yes    Comment: once a week  . Drug use: Never   Medications: I have reviewed the patient's current medications.  Allergies as of 03/21/2020   No Known Allergies          Medication List       Accurate as of Mar 21, 2020 10:18 AM. If you have any questions, ask your nurse or doctor.        fentaNYL 25 MCG/HR  Commonly known as: [place] 1 patch onto the skin every 3 (three) days.   oxyCODONE 5 MG immediate release tablet  Commonly known as: Oxy IR/ROXICODONE  Take 1 tablet (5 mg total) by mouth every 6 (six) hours as needed for moderate pain or severe pain.   pantoprazole 40 MG tablet  Commonly known as: Protonix  Take 1 tablet (40 mg total) by mouth 2 (two) times daily.   polyethylene glycol 17 g packet  Commonly known as: MIRALAX / GLYCOLAX  Take 17 g by mouth daily as needed for moderate constipation or severe constipation.   senna-docusate 8.6-50 MG tablet  Commonly known as: Senokot-S  Take 2 tablets by mouth at bedtime as needed for mild constipation or moderate constipation.   Vitamin D (Ergocalciferol) 1.25 MG (50000 UNIT) Caps capsule  Commonly known as: DRISDOL  Take 1 capsule (50,000 Units total) by mouth every 7 (seven) days for 7 doses.       ROS:  A comprehensive review of systems was negative except for: Constitutional: positive for malaise  Gastrointestinal: positive  for abdominal pain  Blood pressure 96/76, pulse (!) 116, temperature (!) 96.4 F (35.8 C), temperature source Other (Comment), resp. rate 12, height '5\' 1"'  (1.549 m), weight 141 lb (64 kg), SpO2 95 %.  Physical Exam  Vitals reviewed.  HENT:  Head: Normocephalic.  Nose: Nose normal.  Eyes:  Pupils: Pupils are equal, round, and reactive to light.  Cardiovascular:  Rate and Rhythm: Normal rate.  Pulmonary:  Effort: Pulmonary effort is normal.  Abdominal:  General: There is distension.  Palpations: Abdomen is soft.  Tenderness: There is no abdominal tenderness.  Musculoskeletal:  General: No swelling. Normal range of motion.  Cervical back: Normal range of motion.  Skin:  General: Skin is warm and dry.  Neurological:  General: No focal  deficit present.  Mental Status: She is alert and oriented to person, place, and time.  Psychiatric:  Mood and Affect: Mood normal.  Behavior: Behavior normal.  Thought Content: Thought content normal.  Judgment: Judgment normal.   Results:  CT a/p/c reviewed personally- metastatic colon cancer with liver mets, colon not obstructed on CT  Assessment & Plan:  Natalie Washington is a 54 y.o. female with metastatic colon cancer who wants to undergo a port for palliative chemotherapy.  -Discussed the risk of bleeding, infection, injury to vessels, pneumothorax. Will plan for a left sided port.  All questions were answered to the satisfaction of the patient and family.  Virl Cagey  03/21/2020, 10:18 AM

## 2020-03-23 NOTE — OR Nursing (Signed)
Multiple sticks for IV performed by CRNA and Anesthesia.  Ultrasound also used.  Unable to get IV access.  Attempt to put EJ in patient neck by anesthesiologist.

## 2020-03-23 NOTE — Discharge Instructions (Signed)
Keep area clean and dry. You can take a shower in 24 hours. Do not submerge in water.  Take tylenol and ibuprofen for pain control and roxicodone for severe pain.   Implanted Port Insertion, Care After This sheet gives you information about how to care for yourself after your procedure. Your health care provider may also give you more specific instructions. If you have problems or questions, contact your health care provider. What can I expect after the procedure? After the procedure, it is common to have:  Discomfort at the port insertion site.  Bruising on the skin over the port. This should improve over 3-4 days. Follow these instructions at home: [facility] care  After your port is placed, you will get a manufacturer's information card. The card has information about your port. Keep this card with you at all times.  Take care of the port as told by your health care provider. Ask your health care provider if you or a family member can get training for taking care of the port at home. A home health care nurse may also take care of the port.  Make sure to remember what type of port you have. Incision care      Follow instructions from your health care provider about how to take care of your port insertion site. Make sure you: ? Wash your hands with soap and water before and after you change your bandage (dressing). If soap and water are not available, use hand sanitizer. ? Change your dressing as told by your health care provider. ? Leave stitches (sutures), skin glue, or adhesive strips in place. These skin closures may need to stay in place for 2 weeks or longer. If adhesive strip edges start to loosen and curl up, you may trim the loose edges. Do not remove adhesive strips completely unless your health care provider tells you to do that.  Check your port insertion site every day for signs of infection. Check for: ? Redness, swelling, or pain. ? Fluid or blood. ? Warmth. ? Pus or a bad  smell. Activity  Return to your normal activities as told by your health care provider. Ask your health care provider what activities are safe for you.  Do not lift anything that is heavier than 10 lb (4.5 kg), or the limit that you are told, until your health care provider says that it is safe. General instructions  Take over-the-counter and prescription medicines only as told by your health care provider.  Do not take baths, swim, or use a hot tub until your health care provider approves.   You may shower tomorrow.  Do not drive for 24 hours if you were given a sedative during your procedure.  Wear a medical alert bracelet in case of an emergency. This will tell any health care providers that you have a port.  Keep all follow-up visits as told by your health care provider. This is important. Contact a health care provider if:  You cannot flush your port with saline as directed, or you cannot draw blood from the port.  You have a fever or chills.  You have redness, swelling, or pain around your port insertion site.  You have fluid or blood coming from your port insertion site.  Your port insertion site feels warm to the touch.  You have pus or a bad smell coming from the port insertion site. Get help right away if:  You have chest pain or shortness of breath.  You have  bleeding from your port that you cannot control. Summary  Take care of the port as told by your health care provider. Keep the manufacturer's information card with you at all times.  Change your dressing as told by your health care provider.  Contact a health care provider if you have a fever or chills or if you have redness, swelling, or pain around your port insertion site.  Keep all follow-up visits as told by your health care provider. This information is not intended to replace advice given to you by your health care provider. Make sure you discuss any questions you have with your health care  provider. Document Revised: 05/12/2018 Document Reviewed: 05/12/2018 Elsevier Patient Education  [place] POST-ANESTHESIA  IMMEDIATELY FOLLOWING SURGERY:  Do not drive or operate machinery for the first twenty four hours after surgery.  Do not make any important decisions for twenty four hours after surgery or while taking narcotic pain medications or sedatives.  If you develop intractable nausea and vomiting or a severe headache please notify your doctor immediately.  FOLLOW-UP:  Please make an appointment with your surgeon as instructed. You do not need to follow up with anesthesia unless specifically instructed to do so.  WOUND CARE INSTRUCTIONS (if applicable):  Keep a dry clean dressing on the anesthesia/puncture wound site if there is drainage.  Once the wound has quit draining you may leave it open to air.  Generally you should leave the bandage intact for twenty four hours unless there is drainage.  If the epidural site drains for more than 36-48 hours please call the anesthesia department.  QUESTIONS?:  Please feel free to call your physician or the hospital operator if you have any questions, and they will be happy to assist you.

## 2020-03-28 ENCOUNTER — Other Ambulatory Visit (HOSPITAL_COMMUNITY): Payer: Self-pay | Admitting: General Practice

## 2020-03-28 ENCOUNTER — Other Ambulatory Visit (HOSPITAL_COMMUNITY): Payer: Self-pay

## 2020-03-28 ENCOUNTER — Telehealth: Payer: Self-pay | Admitting: General Practice

## 2020-03-28 DIAGNOSIS — C183 Malignant neoplasm of hepatic flexure: Secondary | ICD-10-CM

## 2020-03-28 NOTE — Telephone Encounter (Signed)
[facility] CSW Progress Notes  Patient will receive wheelchair from [place] tomorrow, sister has been in contact w this organization.  Edwyna Shell, LCSW Clinical Social Worker Phone:  [phone]

## 2020-03-28 NOTE — Progress Notes (Signed)
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 

## 2020-03-29 ENCOUNTER — Inpatient Hospital Stay (HOSPITAL_COMMUNITY): Payer: Medicaid Other

## 2020-03-29 ENCOUNTER — Inpatient Hospital Stay (HOSPITAL_BASED_OUTPATIENT_CLINIC_OR_DEPARTMENT_OTHER): Payer: Medicaid Other | Admitting: Hematology

## 2020-03-29 ENCOUNTER — Inpatient Hospital Stay (HOSPITAL_COMMUNITY): Payer: Medicaid Other | Attending: Hematology

## 2020-03-29 ENCOUNTER — Other Ambulatory Visit: Payer: Self-pay

## 2020-03-29 ENCOUNTER — Encounter (HOSPITAL_COMMUNITY): Payer: Self-pay | Admitting: Hematology

## 2020-03-29 VITALS — BP 88/60 | HR 94 | Temp 97.3°F | Resp 18

## 2020-03-29 VITALS — BP 93/67 | HR 98 | Temp 97.5°F | Resp 18

## 2020-03-29 DIAGNOSIS — J90 Pleural effusion, not elsewhere classified: Secondary | ICD-10-CM | POA: Insufficient documentation

## 2020-03-29 DIAGNOSIS — F17210 Nicotine dependence, cigarettes, uncomplicated: Secondary | ICD-10-CM | POA: Insufficient documentation

## 2020-03-29 DIAGNOSIS — R42 Dizziness and giddiness: Secondary | ICD-10-CM | POA: Diagnosis not present

## 2020-03-29 DIAGNOSIS — R599 Enlarged lymph nodes, unspecified: Secondary | ICD-10-CM | POA: Diagnosis not present

## 2020-03-29 DIAGNOSIS — I2510 Atherosclerotic heart disease of native coronary artery without angina pectoris: Secondary | ICD-10-CM | POA: Insufficient documentation

## 2020-03-29 DIAGNOSIS — C183 Malignant neoplasm of hepatic flexure: Secondary | ICD-10-CM | POA: Insufficient documentation

## 2020-03-29 DIAGNOSIS — M545 Low back pain: Secondary | ICD-10-CM | POA: Diagnosis not present

## 2020-03-29 DIAGNOSIS — Z79899 Other long term (current) drug therapy: Secondary | ICD-10-CM | POA: Insufficient documentation

## 2020-03-29 DIAGNOSIS — G479 Sleep disorder, unspecified: Secondary | ICD-10-CM | POA: Insufficient documentation

## 2020-03-29 DIAGNOSIS — C787 Secondary malignant neoplasm of liver and intrahepatic bile duct: Secondary | ICD-10-CM | POA: Insufficient documentation

## 2020-03-29 DIAGNOSIS — N289 Disorder of kidney and ureter, unspecified: Secondary | ICD-10-CM | POA: Insufficient documentation

## 2020-03-29 DIAGNOSIS — E875 Hyperkalemia: Secondary | ICD-10-CM

## 2020-03-29 DIAGNOSIS — R188 Other ascites: Secondary | ICD-10-CM | POA: Diagnosis not present

## 2020-03-29 DIAGNOSIS — D259 Leiomyoma of uterus, unspecified: Secondary | ICD-10-CM | POA: Insufficient documentation

## 2020-03-29 DIAGNOSIS — M7989 Other specified soft tissue disorders: Secondary | ICD-10-CM | POA: Diagnosis not present

## 2020-03-29 DIAGNOSIS — R0602 Shortness of breath: Secondary | ICD-10-CM | POA: Diagnosis not present

## 2020-03-29 DIAGNOSIS — R1310 Dysphagia, unspecified: Secondary | ICD-10-CM | POA: Insufficient documentation

## 2020-03-29 DIAGNOSIS — R109 Unspecified abdominal pain: Secondary | ICD-10-CM | POA: Insufficient documentation

## 2020-03-29 DIAGNOSIS — R160 Hepatomegaly, not elsewhere classified: Secondary | ICD-10-CM | POA: Insufficient documentation

## 2020-03-29 DIAGNOSIS — Z800 Family history of malignant neoplasm of digestive organs: Secondary | ICD-10-CM | POA: Insufficient documentation

## 2020-03-29 DIAGNOSIS — K5900 Constipation, unspecified: Secondary | ICD-10-CM | POA: Diagnosis not present

## 2020-03-29 DIAGNOSIS — I700 Atherosclerosis of aorta: Secondary | ICD-10-CM | POA: Insufficient documentation

## 2020-03-29 DIAGNOSIS — R200 Anesthesia of skin: Secondary | ICD-10-CM | POA: Diagnosis not present

## 2020-03-29 LAB — COMPREHENSIVE METABOLIC PANEL
ALT: 20 U/L (ref 0–44)
AST: 162 U/L — ABNORMAL HIGH (ref 15–41)
Albumin: 1.8 g/dL — ABNORMAL LOW (ref 3.5–5.0)
Alkaline Phosphatase: 240 U/L — ABNORMAL HIGH (ref 38–126)
Anion gap: 19 — ABNORMAL HIGH (ref 5–15)
BUN: 62 mg/dL — ABNORMAL HIGH (ref 6–20)
CO2: 18 mmol/L — ABNORMAL LOW (ref 22–32)
Calcium: 8.2 mg/dL — ABNORMAL LOW (ref 8.9–10.3)
Chloride: 89 mmol/L — ABNORMAL LOW (ref 98–111)
Creatinine, Ser: 1.73 mg/dL — ABNORMAL HIGH (ref 0.44–1.00)
GFR calc Af Amer: 38 mL/min — ABNORMAL LOW (ref >60)
GFR calc non Af Amer: 33 mL/min — ABNORMAL LOW (ref >60)
Glucose, Bld: 80 mg/dL (ref 70–99)
Potassium: 5.3 mmol/L — ABNORMAL HIGH (ref 3.5–5.1)
Sodium: 126 mmol/L — ABNORMAL LOW (ref 135–145)
Total Bilirubin: 6.7 mg/dL — ABNORMAL HIGH (ref 0.3–1.2)
Total Protein: 6.1 g/dL — ABNORMAL LOW (ref 6.5–8.1)

## 2020-03-29 LAB — CBC WITH DIFFERENTIAL/PLATELET
Abs Immature Granulocytes: 0.71 10*3/uL — ABNORMAL HIGH (ref 0.00–0.07)
Basophils Absolute: 0.1 10*3/uL (ref 0.0–0.1)
Basophils Relative: 0 %
Eosinophils Absolute: 0 10*3/uL (ref 0.0–0.5)
Eosinophils Relative: 0 %
HCT: 31.1 % — ABNORMAL LOW (ref 36.0–46.0)
Hemoglobin: 9.5 g/dL — ABNORMAL LOW (ref 12.0–15.0)
Immature Granulocytes: 4 %
Lymphocytes Relative: 10 %
Lymphs Abs: 2.1 10*3/uL (ref 0.7–4.0)
MCH: 26.9 pg (ref 26.0–34.0)
MCHC: 30.5 g/dL (ref 30.0–36.0)
MCV: 88.1 fL (ref 80.0–100.0)
Monocytes Absolute: 2 10*3/uL — ABNORMAL HIGH (ref 0.1–1.0)
Monocytes Relative: 10 %
Neutro Abs: 15.8 10*3/uL — ABNORMAL HIGH (ref 1.7–7.7)
Neutrophils Relative %: 76 %
Platelets: 348 10*3/uL (ref 150–400)
RBC: 3.53 MIL/uL — ABNORMAL LOW (ref 3.87–5.11)
RDW: 22.9 % — ABNORMAL HIGH (ref 11.5–15.5)
WBC: 20.6 10*3/uL — ABNORMAL HIGH (ref 4.0–10.5)
nRBC: 0.6 % — ABNORMAL HIGH (ref 0.0–0.2)

## 2020-03-29 MED ORDER — SODIUM CHLORIDE 0.9% IV SOLUTION: Freq: Once | INTRAVENOUS | Status: DC

## 2020-03-29 MED ORDER — HEPARIN SOD (PORK) LOCK FLUSH 100 UNIT/ML IV SOLN
500.0000 [IU] | Freq: Once | INTRAVENOUS | Status: AC
  Administered 2020-03-29: 500 [IU] via INTRAVENOUS

## 2020-03-29 MED ORDER — INSULIN REGULAR HUMAN 100 UNIT/ML IJ SOLN
10.0000 [IU] | Freq: Once | INTRAMUSCULAR | Status: AC
  Administered 2020-03-29: 10 [IU] via INTRAVENOUS
  Filled 2020-03-29: qty 0.1

## 2020-03-29 MED ORDER — INSULIN REGULAR HUMAN 100 UNIT/ML IJ SOLN
10.0000 [IU] | Freq: Once | INTRAMUSCULAR | Status: DC
  Filled 2020-03-29: qty 0.1

## 2020-03-29 MED ORDER — SODIUM CHLORIDE 0.9% FLUSH
10.0000 mL | INTRAVENOUS | Status: DC | PRN
  Administered 2020-03-29: 10 mL via INTRAVENOUS

## 2020-03-29 MED ORDER — SODIUM CHLORIDE 0.9 % IV SOLN: INTRAVENOUS | Status: AC

## 2020-03-29 MED ORDER — DEXTROSE 50 % IV SOLN
50.0000 mL | Freq: Once | INTRAVENOUS | Status: AC
  Administered 2020-03-29: 50 mL via INTRAVENOUS
  Filled 2020-03-29: qty 50

## 2020-03-29 NOTE — Patient Instructions (Signed)
[place] at [facility] Discharge Instructions  You were seen today by Dr. Delton Coombes. He went over your recent lab results. Take your Oxycodone for breakthrough pain in addition to your pain patch. I am recommending that due to your recent labs, that it would not be wise to start chemotherapy and we could place you on hospice care for pain management. Dr. Delton Coombes will see you back in for labs and follow up.    Thank you for choosing [place] at [facility] to provide your oncology and hematology care.  To afford each patient quality time with our provider, please arrive at least 15 minutes before your scheduled appointment time.   If you have a lab appointment with the [facility] please come in thru the  Main Entrance and check in at the main information desk  You need to re-schedule your appointment should you arrive 10 or more minutes late.  We strive to give you quality time with our providers, and arriving late affects you and other patients whose appointments are after yours.  Also, if you no show three or more times for appointments you may be dismissed from the clinic at the providers discretion.     Again, thank you for choosing [facility].  Our hope is that these requests will decrease the amount of time that you wait before being seen by our physicians.       _____________________________________________________________  Should you have questions after your visit to [facility], please contact our office at [phone] between the hours of 8:00 a.m. and 4:30 p.m.  Voicemails left after 4:00 p.m. will not be returned until the following business day.  For prescription refill requests, have your pharmacy contact our office and allow 72 hours.    Cancer Center Support Programs:   > Cancer Support Group  2nd Tuesday of the month 1pm-2pm, Journey Room

## 2020-03-29 NOTE — Progress Notes (Signed)
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  PRIOR THERAPY: None  NGS Results: Pending  CURRENT THERAPY: To send to hospice  BRIEF ONCOLOGIC HISTORY:  Oncology History  Malignant neoplasm of hepatic flexure ([place])  03/10/2020 Initial Diagnosis   Malignant neoplasm of hepatic flexure ([place])   03/16/2020 Cancer Staging   Staging form: Colon and Rectum, AJCC 8th Edition - Clinical stage from 03/16/2020: Stage IVC (cTX, cNX, cM1c) - Signed by Derek Jack, MD on 03/16/2020   03/29/2020 -  Chemotherapy   The patient had palonosetron (ALOXI) injection 0.25 mg, 0.25 mg, Intravenous,  Once, 0 of 4 cycles leucovorin 664 mg in dextrose 5 % 250 mL infusion, 400 mg/m2, Intravenous,  Once, 0 of 4 cycles oxaliplatin (ELOXATIN) 140 mg in dextrose 5 % 500 mL chemo infusion, 85 mg/m2, Intravenous,  Once, 0 of 4 cycles fluorouracil (ADRUCIL) chemo injection 650 mg, 400 mg/m2, Intravenous,  Once, 0 of 4 cycles fluorouracil (ADRUCIL) 4,000 mg in sodium chloride 0.9 % 70 mL chemo infusion, 2,400 mg/m2, Intravenous, 1 Day/Dose, 0 of 4 cycles bevacizumab-bvzr (ZIRABEV) 300 mg in sodium chloride 0.9 % 100 mL chemo infusion, 5 mg/kg, Intravenous,  Once, 0 of 4 cycles  for chemotherapy treatment.      CANCER STAGING: Cancer Staging Malignant neoplasm of hepatic flexure [facility]) Staging form: Colon and Rectum, AJCC 8th Edition - Clinical stage from 03/16/2020: Stage IVC (cTX, cNX, cM1c) - Signed by Derek Jack, MD on 03/16/2020   INTERVAL HISTORY:  [name], a 54 y.o. female, returns for routine follow-up and consideration for first cycle of chemotherapy. Natalie Washington was last seen on 03/16/2020.  Overall, she tells me she has been feeling pretty well. She is minimally moving by herself, and she is quickly  getting short of breath with exertion. She currently does not have a walker available to her. At this time, she is not eating well, drinking well, sleeping well, or making regular bowel movements. She is really only eating ice daily with two sips of boost per day.    REVIEW OF SYSTEMS:  Review of Systems  Constitutional: Positive for appetite change and fatigue.  HENT:   Positive for trouble swallowing.   Cardiovascular: Positive for leg swelling.  Gastrointestinal: Positive for constipation (Last BM 1.5 weeks ago).  Genitourinary: Positive for difficulty urinating.   Skin: Positive for itching.  Neurological: Positive for dizziness and numbness.  Psychiatric/Behavioral: Positive for sleep disturbance. The patient is nervous/anxious.   All other systems reviewed and are negative.   PAST MEDICAL/SURGICAL HISTORY:  Past Medical History:  Diagnosis Date  . Acid reflux   . Pneumonia    1980   Past Surgical History:  Procedure Laterality Date  . BIOPSY  03/09/2020   Procedure: BIOPSY;  Surgeon: Daneil Dolin, MD;  [facility];  Service: Endoscopy;;  . COLONOSCOPY WITH PROPOFOL N/A 03/09/2020   Procedure: COLONOSCOPY WITH PROPOFOL;  Surgeon: Daneil Dolin, MD;  Location: [facility];  Service: Endoscopy;  Laterality: N/A;  . ORIF HUMERUS FRACTURE Right 04/07/2019   Procedure: OPEN REDUCTION INTERNAL FIXATION (ORIF) PROXIMAL HUMERUS FRACTURE; BICEP TENODESIS;  Surgeon: Hiram Gash, MD;  Location: [facility];  Service: Orthopedics;  Laterality: Right;  . POLYPECTOMY  03/09/2020   Procedure: POLYPECTOMY;  Surgeon: Daneil Dolin, MD;  Location: [facility];  Service: Endoscopy;;  . PORTACATH PLACEMENT Right 03/23/2020   Procedure: INSERTION PALLATIVE PORT-A-CATH (attached catheter in right subclavian);  Surgeon: Virl Cagey, MD;  Location: [facility];  Service: General;  Laterality: Right;  . WISDOM TOOTH EXTRACTION      SOCIAL HISTORY:  Social History   Socioeconomic  History  . Marital status: Single    Spouse name: Not on file  . Number of children: Not on file  . Years of education: Not on file  . Highest education level: Not on file  Occupational History  . Not on file  Tobacco Use  . Smoking status: Current Every Day Smoker    Packs/day: 0.25    Years: 15.00    Pack years: 3.75  . Smokeless tobacco: Never Used  Substance and Sexual Activity  . Alcohol use: Yes    Comment: once a week  . Drug use: Never  . Sexual activity: Not on file  Other Topics Concern  . Not on file  Social History Narrative  . Not on file   Social Determinants of Health   Financial Resource Strain:   . Difficulty of Paying Living Expenses:   Food Insecurity:   . Worried About Charity fundraiser in the Last Year:   . Arboriculturist in the Last Year:   Transportation Needs:   . Film/video editor (Medical):   Marland Kitchen Lack of Transportation (Non-Medical):   Physical Activity:   . Days of Exercise per Week:   . Minutes of Exercise per Session:   Stress:   . Feeling of Stress :   Social Connections:   . Frequency of Communication with Friends and Family:   . Frequency of Social Gatherings with Friends and Family:   . Attends Religious Services:   . Active Member of Clubs or Organizations:   . Attends Archivist Meetings:   Marland Kitchen Marital Status:   Intimate Partner Violence:   . Fear of Current or Ex-Partner:   . Emotionally Abused:   Marland Kitchen Physically Abused:   . Sexually Abused:     FAMILY HISTORY:  Family History  Problem Relation Age of Onset  . Colon cancer Father        unsure age of onset. Passed away in Jan 20, 2005    CURRENT MEDICATIONS:  Current Outpatient Medications  Medication Sig Dispense Refill  . BEVACIZUMAB IV Inject into the vein every 14 (fourteen) days.    . fentaNYL (DURAGESIC) 25 MCG/HR Place 1 patch onto the skin every 3 (three) days. 10 patch 0  . Fluorouracil (ADRUCIL IV) Inject into the vein every 14 (fourteen) days.    Marland Kitchen  LEUCOVORIN CALCIUM IV Inject into the vein every 14 (fourteen) days.    Marland Kitchen lidocaine-prilocaine (EMLA) cream Apply small amount over port site 1 hour prior to appointment.  Cover with plastic wrap 30 g 3  . OXALIPLATIN IV Inject into the vein every 14 (fourteen) days.    Marland Kitchen oxyCODONE (OXY IR/ROXICODONE) 5 MG immediate release tablet Take 1 tablet (5 mg total) by mouth every 6 (six) hours as needed for severe pain or breakthrough pain. 5 tablet 0  . polyethylene glycol (MIRALAX / GLYCOLAX) 17 g packet Take 17 g by mouth daily as needed for moderate constipation or severe constipation. 14 each 0  . prochlorperazine (COMPAZINE) 10 MG tablet Take 1 tablet (10 mg total) by mouth every 6 (six) hours as needed for nausea or vomiting. 45 tablet 2  .  senna-docusate (SENOKOT-S) 8.6-50 MG tablet Take 2 tablets by mouth at bedtime as needed for mild constipation or moderate constipation. 30 tablet 0  . Vitamin D, Ergocalciferol, (DRISDOL) 1.25 MG (50000 UNIT) CAPS capsule Take 1 capsule (50,000 Units total) by mouth every 7 (seven) days for 7 doses. 7 capsule 0  . pantoprazole (PROTONIX) 40 MG tablet Take 1 tablet (40 mg total) by mouth 2 (two) times daily. 120 tablet 0   No current facility-administered medications for this visit.    ALLERGIES:  No Known Allergies  PHYSICAL EXAM:  Performance status (ECOG): 3 - Symptomatic, >50% confined to bed  Vitals:   03/29/20 0816  BP: (!) 88/60  Pulse: 94  Resp: 18  Temp: (!) 97.3 F (36.3 C)  SpO2: 100%   Wt Readings from Last 3 Encounters:  03/21/20 141 lb (64 kg)  03/16/20 149 lb (67.6 kg)  03/08/20 116 lb (52.6 kg)   Physical Exam Eyes:     General: Scleral icterus present.  Pulmonary:     Breath sounds: Examination of the right-lower field reveals decreased breath sounds. Examination of the left-lower field reveals decreased breath sounds. Decreased breath sounds present.     LABORATORY DATA:  I have reviewed the labs as listed.  CBC Latest  Ref Rng & Units 03/29/2020 03/10/2020 03/09/2020  WBC 4.0 - 10.5 K/uL 20.6(H) 14.0(H) 12.2(H)  Hemoglobin 12.0 - 15.0 g/dL 9.5(L) 8.0(L) 7.7(L)  Hematocrit 36.0 - 46.0 % 31.1(L) 26.6(L) 25.5(L)  Platelets 150 - 400 K/uL 348 389 339   CMP Latest Ref Rng & Units 03/29/2020 03/10/2020 03/09/2020  Glucose 70 - 99 mg/dL 80 65(L) 97  BUN 6 - 20 mg/dL 62(H) 6 7  Creatinine 0.44 - 1.00 mg/dL 1.73(H) 0.35(L) 0.32(L)  Sodium 135 - 145 mmol/L 126(L) 136 135  Potassium 3.5 - 5.1 mmol/L 5.3(H) 3.5 3.6  Chloride 98 - 111 mmol/L 89(L) 101 101  CO2 22 - 32 mmol/L 18(L) 24 24  Calcium 8.9 - 10.3 mg/dL 8.2(L) 7.5(L) 7.1(L)  Total Protein 6.5 - 8.1 g/dL 6.1(L) 5.0(L) 5.0(L)  Total Bilirubin 0.3 - 1.2 mg/dL 6.7(H) 1.6(H) 1.5(H)  Alkaline Phos 38 - 126 U/L 240(H) 273(H) 283(H)  AST 15 - 41 U/L 162(H) 56(H) 54(H)  ALT 0 - 44 U/L 20 15 15     DIAGNOSTIC IMAGING:  I have independently reviewed the scans and discussed with the patient. CT CHEST W CONTRAST  Result Date: 03/10/2020 CLINICAL DATA:  Cancer of unknown primary, staging EXAM: CT CHEST WITH CONTRAST TECHNIQUE: Multidetector CT imaging of the chest was performed during intravenous contrast administration. CONTRAST:  61mL OMNIPAQUE IOHEXOL 300 MG/ML  SOLN COMPARISON:  None . FINDINGS: Cardiovascular: No significant vascular findings. Normal heart size. No pericardial effusion. Mediastinum/Nodes: No enlarged mediastinal, hilar, or axillary lymph nodes. Thyroid gland, trachea, and esophagus demonstrate no significant findings. Lungs/Pleura: Moderate volume right pleural effusion is identified. Small left pleural effusion also noted. Approximately 50% atelectasis of the right lower lobe noted. Passive atelectasis is noted overlying the left pleural effusion. Near complete atelectasis of the right middle lobe noted. No pulmonary nodule or mass identified. Upper Abdomen: Marked hepatomegaly with extensive liver metastases identified. Musculoskeletal: Spondylosis  identified within the thoracic spine. No acute or aggressive osseous lesions identified. IMPRESSION: 1. No mass or adenopathy identified within the chest. 2. Moderate volume right pleural effusion and small left pleural effusion. 3. Near complete atelectasis of the right middle lobe and right lower lobe. With subsegmental atelectasis of the left lower lobe.  4. Marked hepatomegaly with extensive liver metastases. Electronically Signed   By: Kerby Moors M.D.   On: 03/10/2020 19:53   CT ABDOMEN PELVIS W CONTRAST  Result Date: 03/08/2020 CLINICAL DATA:  Abdominal pain. Evaluate for bowel obstruction. EXAM: CT ABDOMEN AND PELVIS WITH CONTRAST TECHNIQUE: Multidetector CT imaging of the abdomen and pelvis was performed using the standard protocol following bolus administration of intravenous contrast. CONTRAST:  89mL OMNIPAQUE IOHEXOL 300 MG/ML  SOLN COMPARISON:  None. FINDINGS: Lower chest: Calcification identified within the LAD coronary artery. The heart size appears within normal limits. No pericardial effusion. Small right pleural effusion. Imaged portions of the lungs are otherwise clear. Hepatobiliary: The liver is enlarged. There are innumerable ill-defined, peripherally enhancing lesions throughout the liver compatible with metastatic disease. Lesions are too numerous to count including: Index segment 4 lesion measures 7.7 x 6.4 cm, image 26/2. Index lesion within segment 8 measures 7.4 x 8.4 cm. Index lesion within segment 2 measures 4.3 by 3.4 cm, image 25/2. Evidence of intrahepatic biliary obstruction is noted with dilatation of the intrahepatic bile ducts and normal common bile duct, image 23/2. Gallbladder negative. Pancreas: Unremarkable. No pancreatic ductal dilatation or surrounding inflammatory changes. Spleen: Normal in size without focal abnormality. Adrenals/Urinary Tract: Normal adrenal glands. No kidney mass or hydronephrosis identified bilaterally. Bladder unremarkable. Stomach/Bowel:  Stomach is nondistended. No bowel wall thickening, no small bowel wall thickening, inflammation or distension. Circumferential mass involving the hepatic flexure is identified this, image 48/2 and image 53/6. This involves a approximate 5.8 cm segment of colon, image 48/6. No obstruction. Vascular/Lymphatic: Aortic atherosclerosis without aneurysm. Enlarged ileocolic lymph node measures 2.2 x 2.3 cm. Gastrohepatic ligament lymph node measures 1.3 cm short axis, image 31/2. Prominent but non pathologically enlarged external iliac lymph nodes are identified including 9 mm left external iliac node, image 75/2. Reproductive: The uterus is enlarged and contains 2 high attenuation masses which are favored to represent fibroids. The largest measures 4 cm, image 69/7. No adnexal mass identified. Other: Moderate volume of ascites is noted within the abdomen and pelvis. Although no definite peritoneal nodule or mass identified, peritoneal carcinomatosis cannot be excluded. Diffuse body wall edema is identified compatible with anasarca. Musculoskeletal: No acute or suspicious bone lesions identified. IMPRESSION: 1. Nonobstructing circumferential mass involving the hepatic flexure is identified concerning for colon cancer. Further investigation with colonoscopy is advised. 2. Innumerable liver metastases are noted. Additionally, there are enlarged ileocolic and gastrohepatic ligament lymph node concerning for metastatic adenopathy. 3. Moderate volume of ascites noted within the abdomen and pelvis. Although no definite peritoneal nodule or mass identified, peritoneal carcinomatosis cannot be excluded. Consider further evaluation with diagnostic paracentesis. 4. Small right pleural effusion. 5. Aortic atherosclerosis. LAD coronary artery calcifications noted. 6. Enlarged uterus containing 2 high attenuation masses which are favored to represent fibroids. Aortic Atherosclerosis (ICD10-I70.0). Electronically Signed   By: Kerby Moors M.D.   On: 03/08/2020 15:14   DG Chest Port 1 View  Result Date: 03/23/2020 CLINICAL DATA:  Port-A-Cath insertion EXAM: PORTABLE CHEST 1 VIEW COMPARISON:  Portable exam 1158 hours compared to 10/14/2015 and correlated with CT chest of 03/10/2020 FINDINGS: RIGHT subclavian Port-A-Cath with tip projecting over SVC. Normal heart size, mediastinal contours, and pulmonary vascularity. RIGHT pleural effusion and basilar atelectasis. Generally low lung volumes. Minimal LEFT basilar atelectasis as well. No infiltrate or pneumothorax. Scattered mild endplate spur formation thoracic spine. IMPRESSION: No pneumothorax following Port-A-Cath insertion. Port-A-Cath tip projects over SVC. Low lung volumes with bibasilar atelectasis and  small RIGHT pleural effusion. Electronically Signed   By: Lavonia Dana M.D.   On: 03/23/2020 14:15   DG C-Arm 1-60 Min-No Report  Result Date: 03/23/2020 Fluoroscopy was utilized by the requesting physician.  No radiographic interpretation.     ASSESSMENT:  1.  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  CEA was 1818.   PLAN:  1.  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 -We reviewed her labs.  Total bilirubin is elevated at 6.7.  Albumin is 1.8.  AST is elevated at 162.  Creatinine is 1.73. -She is not eating or drinking much.  I do not believe she is a candidate for any systemic therapy. -I have talked to her and her mother about best supportive care in the form of hospice. -They are agreeable.  We will make a referral to [facility].  2.  Renal insufficiency/hyperkalemia: -We will give her D50 and insulin today.  We will also give 1 L of normal saline.  3.  Low back pain: -Continue fentanyl 25 mcg patch. -Continue oxycodone 5 mg as needed for breakthrough  pain. -Hospice to take over pain control.    Orders placed this encounter:  No orders of the defined types were placed in this encounter.  Total time spent is 30 minutes with more than 50% of the time spent face-to-face discussing palliative care options, counseling and coordination of care.  Derek Jack, MD [facility] [phone]   I, Jacqualyn Posey, am acting as a scribe for Dr. Sanda Linger.  I, Derek Jack MD, have reviewed the above documentation for accuracy and completeness, and I agree with the above.

## 2020-03-29 NOTE — Patient Instructions (Signed)
[facility] at [facility] Discharge Instructions  Received IV hydration today. Follow-up as scheduled   Thank you for choosing [facility] at [facility] to provide your oncology and hematology care.  To afford each patient quality time with our provider, please arrive at least 15 minutes before your scheduled appointment time.   If you have a lab appointment with the Cancer Center please come in thru the Main Entrance and check in at the main information desk.  You need to re-schedule your appointment should you arrive 10 or more minutes late.  We strive to give you quality time with our providers, and arriving late affects you and other patients whose appointments are after yours.  Also, if you no show three or more times for appointments you may be dismissed from the clinic at the providers discretion.     Again, thank you for choosing [facility].  Our hope is that these requests will decrease the amount of time that you wait before being seen by our physicians.       _____________________________________________________________  Should you have questions after your visit to [facility], please contact our office at [phone] between the hours of 8:00 a.m. and 4:30 p.m.  Voicemails left after 4:00 p.m. will not be returned until the following business day.  For prescription refill requests, have your pharmacy contact our office and allow 72 hours.    Due to Covid, you will need to wear a mask upon entering the hospital. If you do not have a mask, a mask will be given to you at the Main Entrance upon arrival. For doctor visits, patients may have 1 support person with them. For treatment visits, patients can not have anyone with them due to social distancing guidelines and our immunocompromised population.     

## 2020-03-29 NOTE — Progress Notes (Signed)
Patient has been assessed, vital signs and labs have been reviewed by Dr. Delton Coombes. Please give 1 liter NS over 2 hours, give 10 units regular insulin IV as well as 1amp D50 per Dr. Delton Coombes. She will not receive treatments, Hospice consult sent.

## 2020-03-29 NOTE — Patient Instructions (Signed)
[facility] at [facility] Discharge Instructions  Labs drawn from portacath today   Thank you for choosing [facility] at [facility] to provide your oncology and hematology care.  To afford each patient quality time with our provider, please arrive at least 15 minutes before your scheduled appointment time.   If you have a lab appointment with the Cancer Center please come in thru the Main Entrance and check in at the main information desk.  You need to re-schedule your appointment should you arrive 10 or more minutes late.  We strive to give you quality time with our providers, and arriving late affects you and other patients whose appointments are after yours.  Also, if you no show three or more times for appointments you may be dismissed from the clinic at the providers discretion.     Again, thank you for choosing [facility].  Our hope is that these requests will decrease the amount of time that you wait before being seen by our physicians.       _____________________________________________________________  Should you have questions after your visit to [facility], please contact our office at [phone] between the hours of 8:00 a.m. and 4:30 p.m.  Voicemails left after 4:00 p.m. will not be returned until the following business day.  For prescription refill requests, have your pharmacy contact our office and allow 72 hours.    Due to Covid, you will need to wear a mask upon entering the hospital. If you do not have a mask, a mask will be given to you at the Main Entrance upon arrival. For doctor visits, patients may have 1 support person with them. For treatment visits, patients can not have anyone with them due to social distancing guidelines and our immunocompromised population.     

## 2020-03-29 NOTE — Progress Notes (Signed)
0905 Labs reviewed with and pt seen by Dr. Delton Coombes and pt's chemo tx to be held today with hospice consult ordered. Pt to received 1 liter of NS over 2 hours as well as  1 amp of D 50 and 10 units of Regular insulin ti be given today per MD                             Natalie Washington tolerated IV hydration and D 50/Regular insulin well without complaints or incident. VSS upon discharge. Pt discharged via wheelchair in stable condition accompanied by family member

## 2020-03-31 ENCOUNTER — Encounter (HOSPITAL_COMMUNITY): Payer: Self-pay

## 2020-04-03 ENCOUNTER — Other Ambulatory Visit (HOSPITAL_COMMUNITY): Payer: Self-pay

## 2020-04-03 ENCOUNTER — Ambulatory Visit (HOSPITAL_COMMUNITY): Payer: Self-pay | Admitting: Hematology

## 2020-04-03 ENCOUNTER — Encounter: Payer: Self-pay | Admitting: Hematology

## 2020-04-03 ENCOUNTER — Ambulatory Visit (HOSPITAL_COMMUNITY): Payer: Self-pay

## 2020-04-04 ENCOUNTER — Other Ambulatory Visit (HOSPITAL_COMMUNITY): Payer: Self-pay | Admitting: General Practice

## 2020-04-04 ENCOUNTER — Encounter (HOSPITAL_COMMUNITY): Payer: Self-pay | Admitting: Hematology

## 2020-04-05 ENCOUNTER — Encounter (HOSPITAL_COMMUNITY): Payer: Self-pay

## 2020-04-27 DEATH — deceased

## 2021-03-03 IMAGING — CT CT HEAD W/O CM
3 series · 16 of 47 positions shown, 19 images · non-contrast
Comparison: None.

CLINICAL DATA: Dizziness for several days. Vomiting and diarrhea.
No reported injury.

EXAM:
CT HEAD WITHOUT CONTRAST
TECHNIQUE: Contiguous axial images were obtained from the base of the skull
through the vertex without intravenous contrast.

[Series 2: head w o · axial · 0.40mm/px · z∈[+34,+159]mm · 10 of 31 slices shown, 13 images]
[im 3/31  brain]
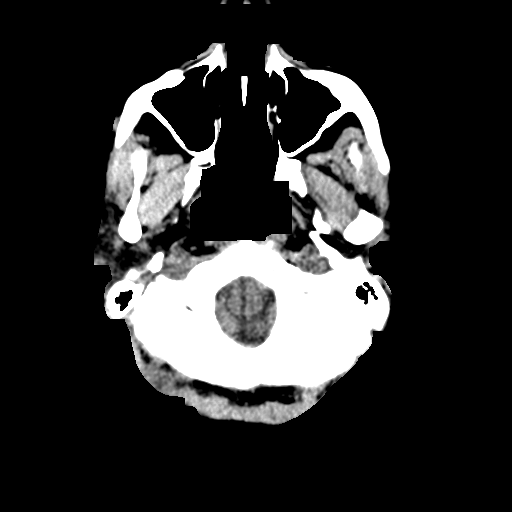
[im 3/31  bone]
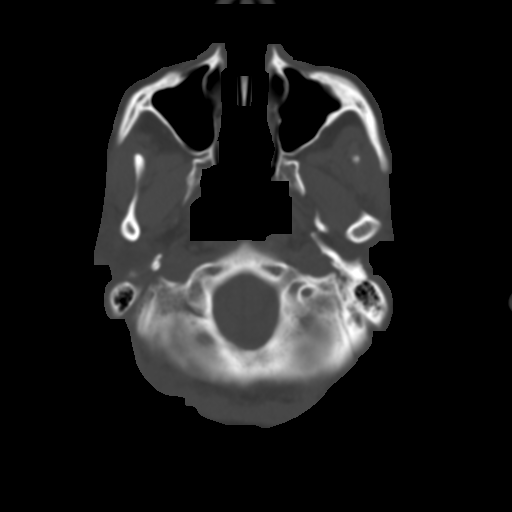
[im 6/31  brain]
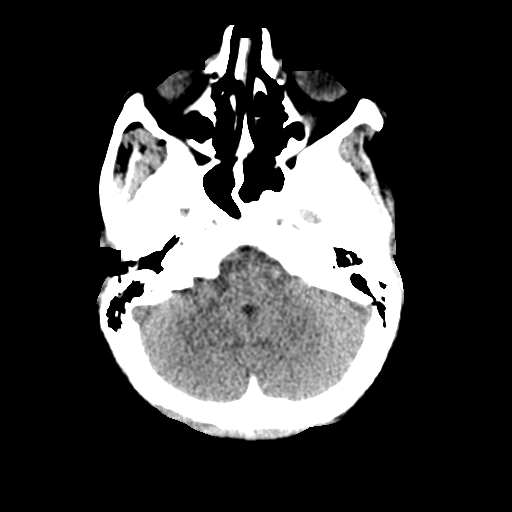
[im 9/31  brain]
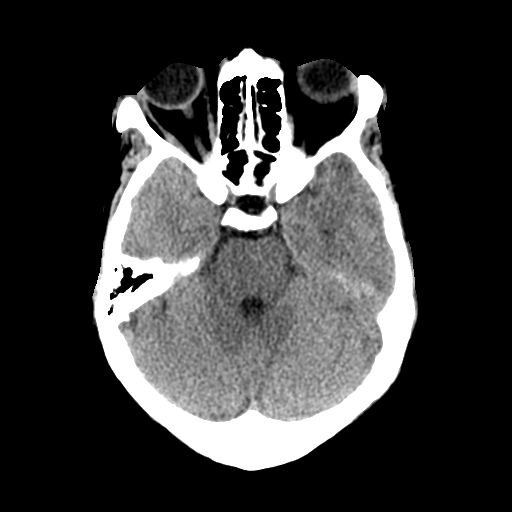
[im 11/31  brain]
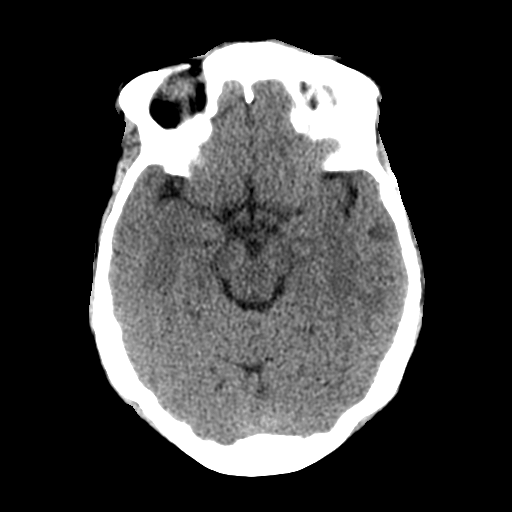
[im 14/31  brain]
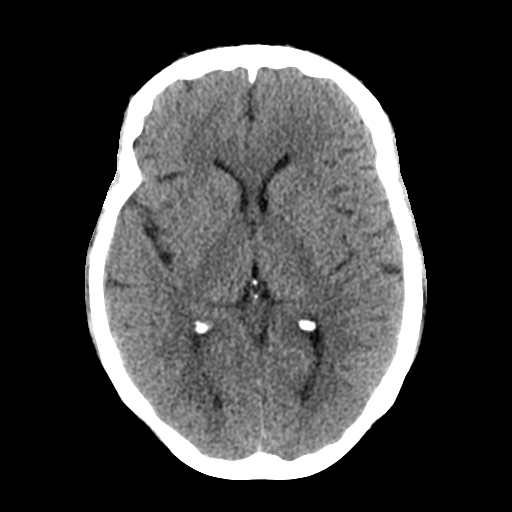
[im 14/31  bone]
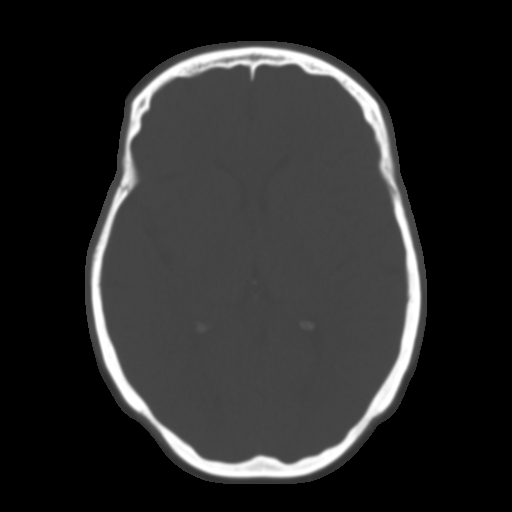
[im 17/31  brain]
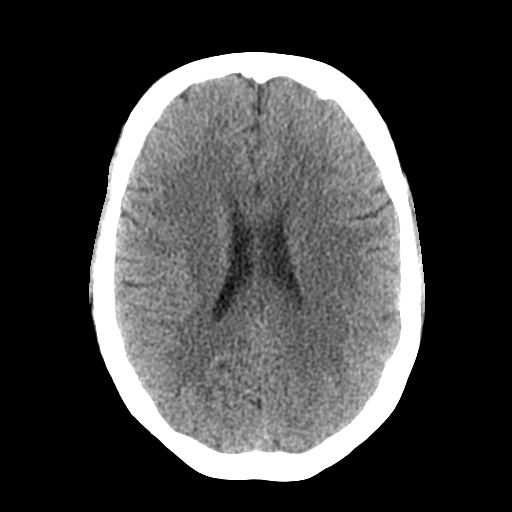
[im 20/31  brain]
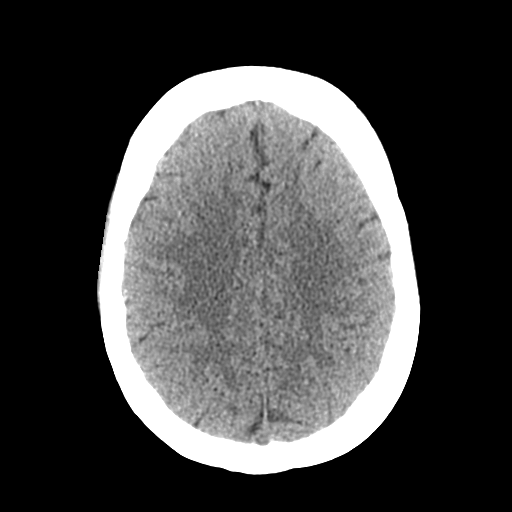
[im 23/31  brain]
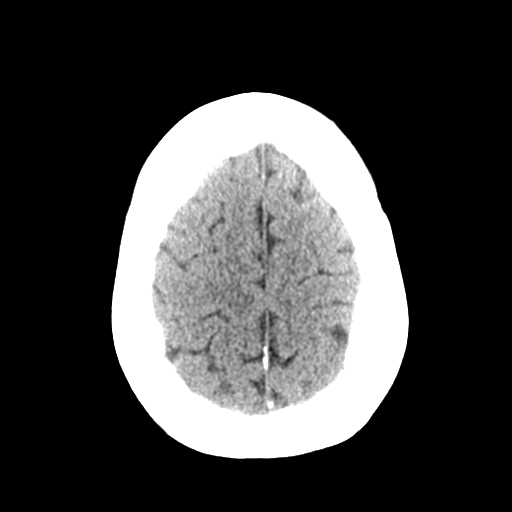
[im 25/31  brain]
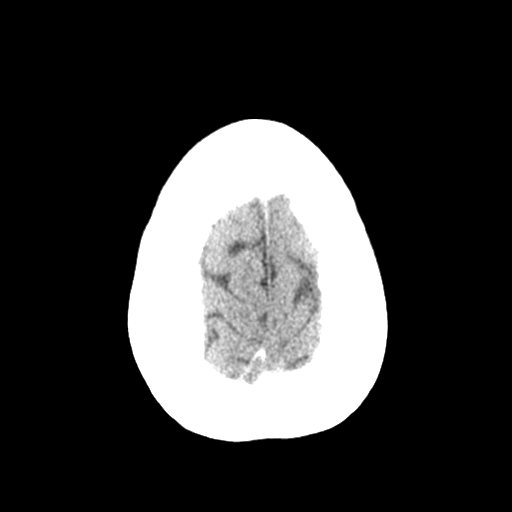
[im 25/31  bone]
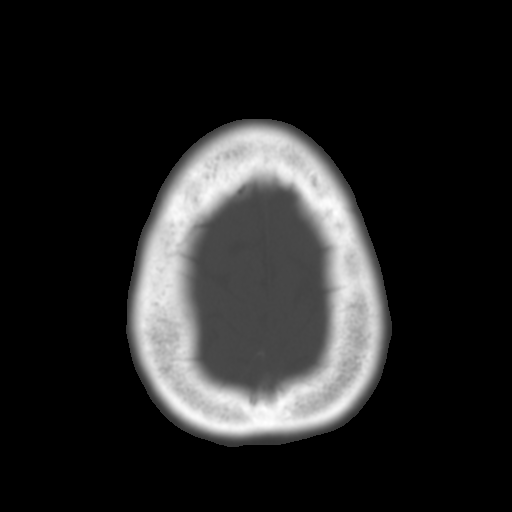
[im 28/31  brain]
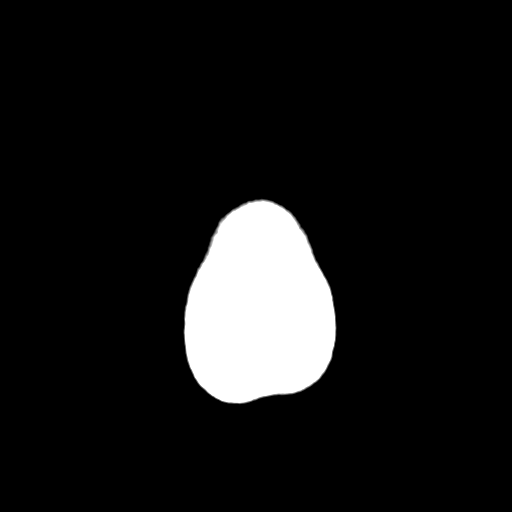

[Series 4: coronal soft · coronal · 0.29mm/px · 3 of 67 slices shown]
[im 23/67  brain]
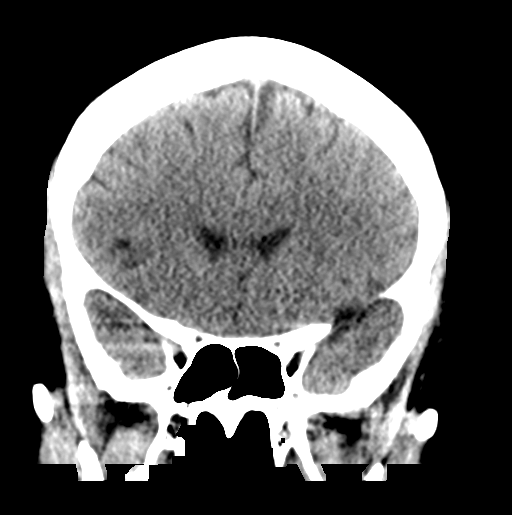
[im 30/67  brain]
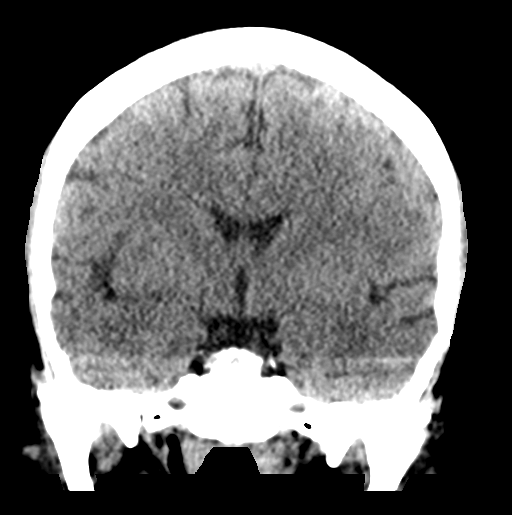
[im 37/67  brain]
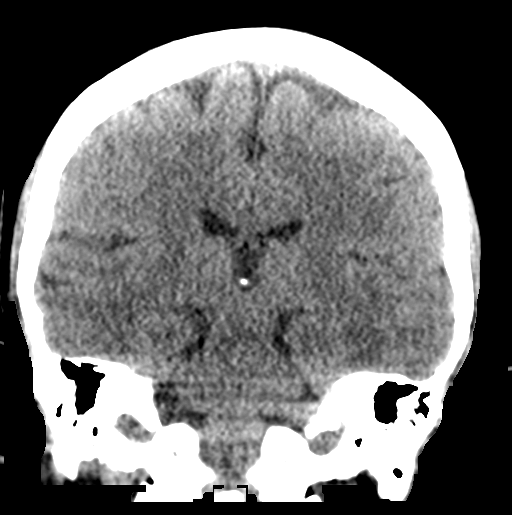

[Series 5: sagittal soft · sagittal · 0.31mm/px · 3 of 53 slices shown]
[im 18/53  brain]
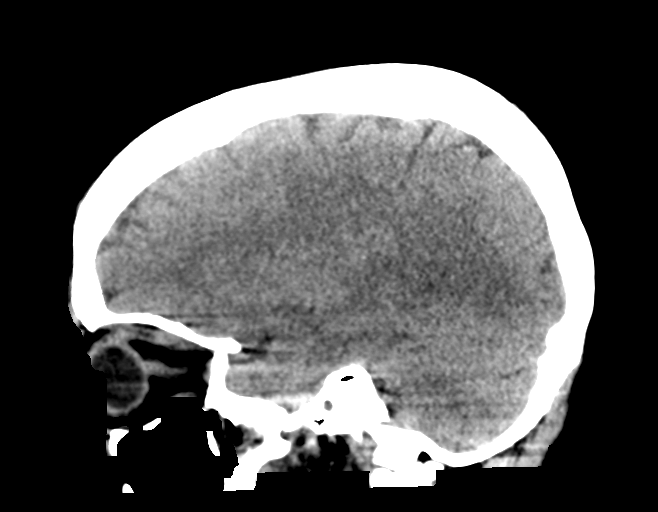
[im 27/53  brain]
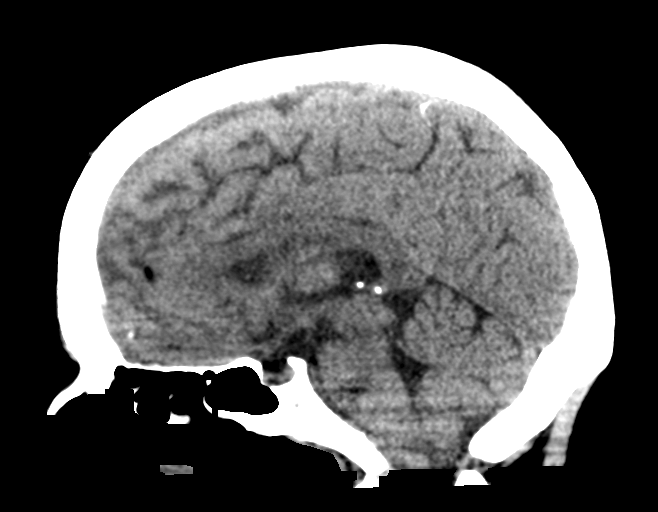
[im 35/53  brain]
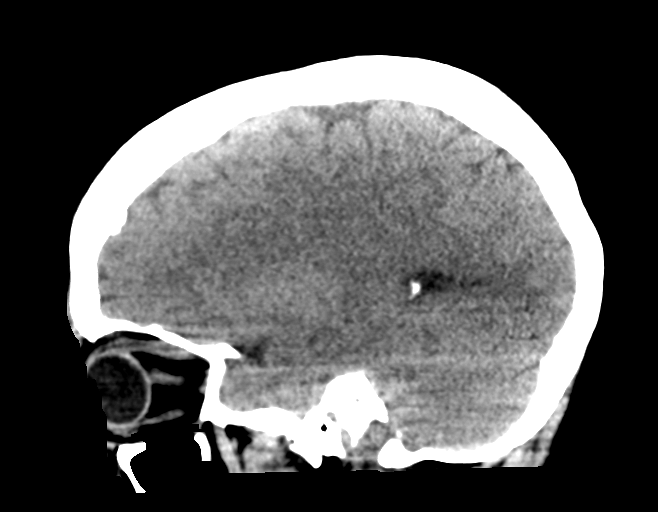

[16 of 47 positions shown; findings below may reference images not displayed]

FINDINGS: Brain: No evidence of parenchymal hemorrhage or extra-axial fluid
collection. No mass lesion, mass effect, or midline shift. No CT
evidence of acute infarction. Cerebral volume is age appropriate. No
ventriculomegaly.

Vascular: No acute abnormality.

Skull: No evidence of calvarial fracture.

Sinuses/Orbits: The visualized paranasal sinuses are essentially
clear.

Other:  The mastoid air cells are unopacified.
IMPRESSION: Negative head CT without contrast. No evidence of acute intracranial
abnormality.

## 2021-05-02 IMAGING — DX DG CHEST 1V PORT
1 series · 1 of 1 positions shown · non-contrast
Comparison: Portable exam 1141 hours compared to 10/14/2015 and
correlated with CT chest of 03/10/2020

CLINICAL DATA: Port-A-Cath insertion

EXAM:
PORTABLE CHEST 1 VIEW

[chest ap]
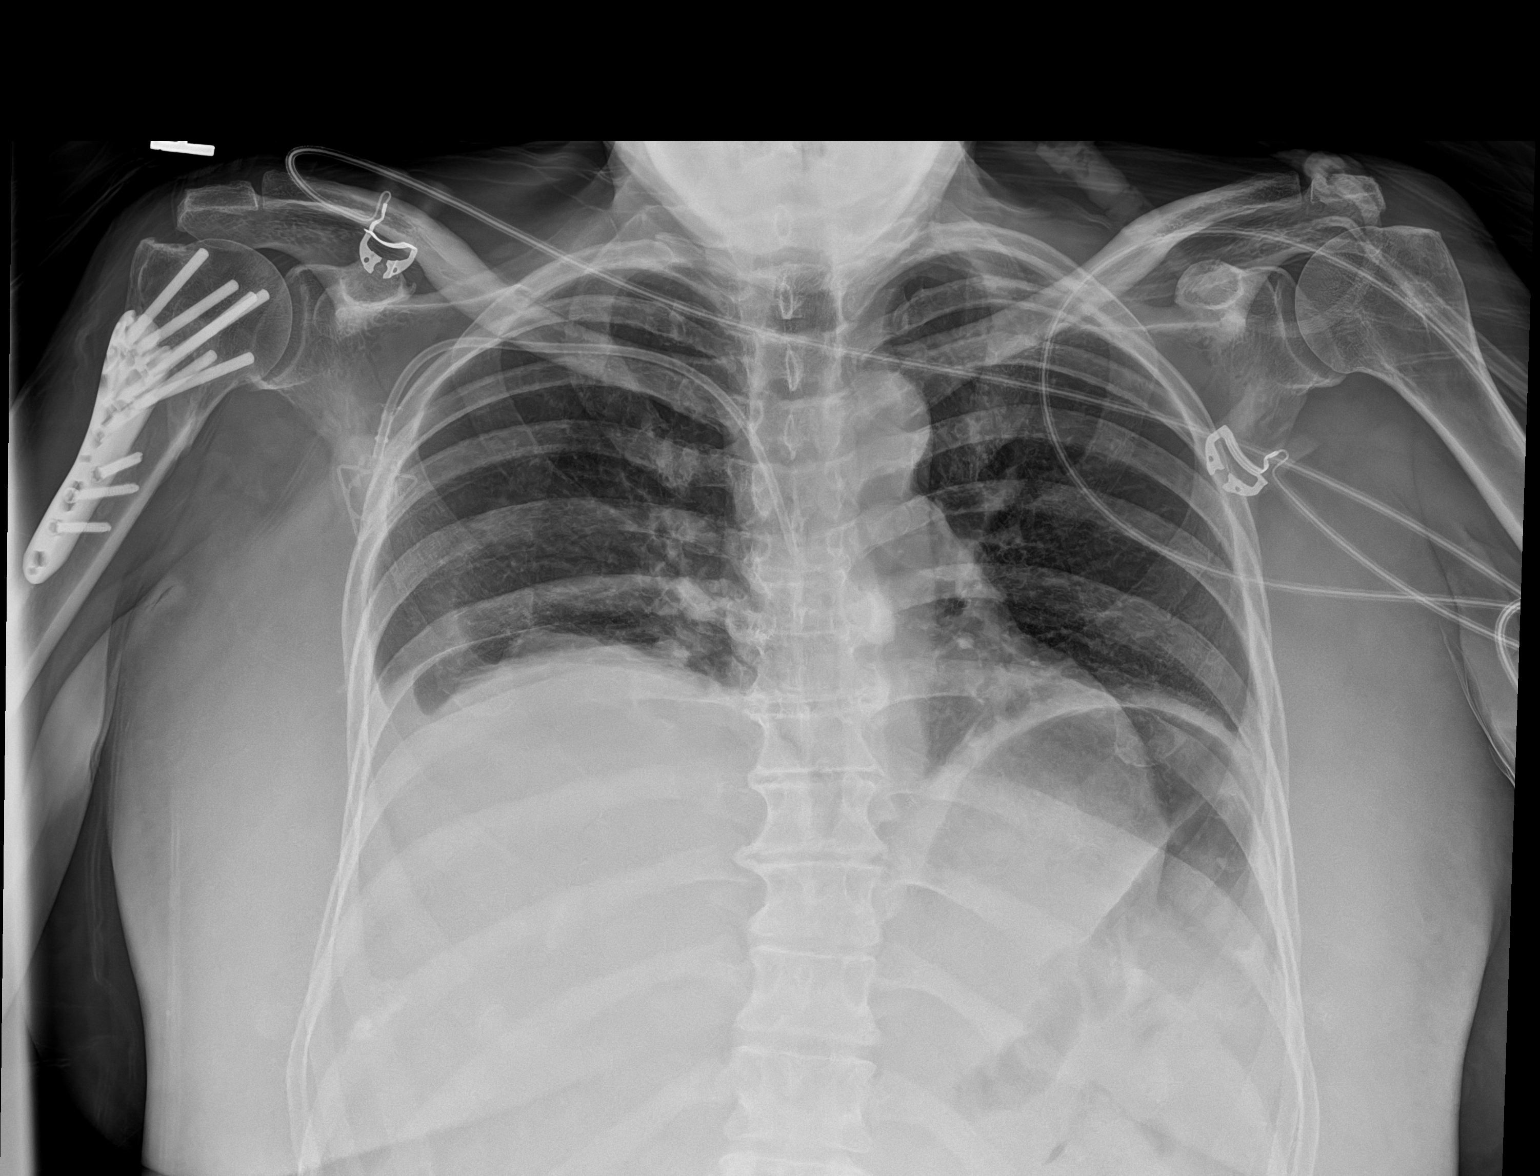

[1 of 1 positions shown; findings below may reference images not displayed]

FINDINGS: RIGHT subclavian Port-A-Cath with tip projecting over SVC.

Normal heart size, mediastinal contours, and pulmonary vascularity.

RIGHT pleural effusion and basilar atelectasis.

Generally low lung volumes.

Minimal LEFT basilar atelectasis as well.

No infiltrate or pneumothorax.

Scattered mild endplate spur formation thoracic spine.
IMPRESSION: No pneumothorax following Port-A-Cath insertion.

Port-A-Cath tip projects over SVC.

Low lung volumes with bibasilar atelectasis and small RIGHT pleural
effusion.
